# Patient Record
Sex: Female | Born: 1960 | Race: Black or African American | Hispanic: No | Marital: Married | Smoking: Never smoker
Health system: Southern US, Community
[De-identification: ages and names within clinical notes are randomized; demographics above are authoritative.]

## PROBLEM LIST (undated history)

## (undated) DIAGNOSIS — R933 Abnormal findings on diagnostic imaging of other parts of digestive tract: Secondary | ICD-10-CM

## (undated) DIAGNOSIS — T4145XA Adverse effect of unspecified anesthetic, initial encounter: Secondary | ICD-10-CM

## (undated) DIAGNOSIS — F32A Depression, unspecified: Secondary | ICD-10-CM

## (undated) DIAGNOSIS — A630 Anogenital (venereal) warts: Secondary | ICD-10-CM

## (undated) DIAGNOSIS — E785 Hyperlipidemia, unspecified: Secondary | ICD-10-CM

## (undated) DIAGNOSIS — Z780 Asymptomatic menopausal state: Secondary | ICD-10-CM

## (undated) DIAGNOSIS — T8859XA Other complications of anesthesia, initial encounter: Secondary | ICD-10-CM

## (undated) DIAGNOSIS — F329 Major depressive disorder, single episode, unspecified: Secondary | ICD-10-CM

## (undated) HISTORY — DX: Hyperlipidemia, unspecified: E78.5

## (undated) HISTORY — PX: BUNIONECTOMY: SHX129

## (undated) HISTORY — DX: Major depressive disorder, single episode, unspecified: F32.9

## (undated) HISTORY — DX: Anogenital (venereal) warts: A63.0

## (undated) HISTORY — DX: Depression, unspecified: F32.A

---

## 1998-12-24 ENCOUNTER — Other Ambulatory Visit: Admission: RE | Admit: 1998-12-24 | Discharge: 1998-12-24 | Payer: Self-pay | Admitting: Obstetrics

## 1999-07-07 ENCOUNTER — Ambulatory Visit (HOSPITAL_COMMUNITY): Admission: RE | Admit: 1999-07-07 | Discharge: 1999-07-07 | Payer: Self-pay | Admitting: Obstetrics

## 2001-10-26 ENCOUNTER — Encounter: Payer: Self-pay | Admitting: Emergency Medicine

## 2001-10-26 ENCOUNTER — Emergency Department (HOSPITAL_COMMUNITY): Admission: EM | Admit: 2001-10-26 | Discharge: 2001-10-26 | Payer: Self-pay | Admitting: Emergency Medicine

## 2002-01-03 ENCOUNTER — Inpatient Hospital Stay (HOSPITAL_COMMUNITY): Admission: EM | Admit: 2002-01-03 | Discharge: 2002-01-05 | Payer: Self-pay | Admitting: Psychiatry

## 2002-04-03 ENCOUNTER — Encounter: Admission: RE | Admit: 2002-04-03 | Discharge: 2002-04-03 | Payer: Self-pay | Admitting: Obstetrics

## 2002-04-03 ENCOUNTER — Encounter: Payer: Self-pay | Admitting: Obstetrics

## 2003-08-12 ENCOUNTER — Emergency Department (HOSPITAL_COMMUNITY): Admission: EM | Admit: 2003-08-12 | Discharge: 2003-08-12 | Payer: Self-pay | Admitting: Emergency Medicine

## 2005-04-14 ENCOUNTER — Emergency Department (HOSPITAL_COMMUNITY): Admission: EM | Admit: 2005-04-14 | Discharge: 2005-04-14 | Payer: Self-pay | Admitting: Family Medicine

## 2005-07-17 ENCOUNTER — Encounter: Admission: RE | Admit: 2005-07-17 | Discharge: 2005-07-17 | Payer: Self-pay | Admitting: Obstetrics

## 2005-07-30 ENCOUNTER — Encounter: Admission: RE | Admit: 2005-07-30 | Discharge: 2005-07-30 | Payer: Self-pay | Admitting: Obstetrics

## 2005-09-16 ENCOUNTER — Emergency Department (HOSPITAL_COMMUNITY): Admission: EM | Admit: 2005-09-16 | Discharge: 2005-09-16 | Payer: Self-pay | Admitting: Emergency Medicine

## 2006-08-05 ENCOUNTER — Encounter: Admission: RE | Admit: 2006-08-05 | Discharge: 2006-08-05 | Payer: Self-pay | Admitting: Obstetrics

## 2006-12-21 HISTORY — PX: HYSTEROSCOPY W/ ENDOMETRIAL ABLATION: SUR665

## 2007-04-19 ENCOUNTER — Emergency Department (HOSPITAL_COMMUNITY): Admission: EM | Admit: 2007-04-19 | Discharge: 2007-04-19 | Payer: Self-pay | Admitting: Emergency Medicine

## 2007-06-02 ENCOUNTER — Encounter (INDEPENDENT_AMBULATORY_CARE_PROVIDER_SITE_OTHER): Payer: Self-pay | Admitting: Obstetrics

## 2007-06-02 ENCOUNTER — Ambulatory Visit (HOSPITAL_COMMUNITY): Admission: RE | Admit: 2007-06-02 | Discharge: 2007-06-02 | Payer: Self-pay | Admitting: Obstetrics

## 2007-08-08 ENCOUNTER — Ambulatory Visit (HOSPITAL_COMMUNITY): Admission: RE | Admit: 2007-08-08 | Discharge: 2007-08-08 | Payer: Self-pay | Admitting: Obstetrics

## 2008-03-18 ENCOUNTER — Emergency Department (HOSPITAL_COMMUNITY): Admission: EM | Admit: 2008-03-18 | Discharge: 2008-03-18 | Payer: Self-pay | Admitting: Emergency Medicine

## 2010-06-18 ENCOUNTER — Ambulatory Visit (HOSPITAL_COMMUNITY): Admission: RE | Admit: 2010-06-18 | Discharge: 2010-06-18 | Payer: Self-pay | Admitting: Obstetrics

## 2011-01-02 ENCOUNTER — Encounter: Payer: Self-pay | Admitting: Podiatry

## 2011-01-02 ENCOUNTER — Ambulatory Visit (HOSPITAL_COMMUNITY)
Admission: RE | Admit: 2011-01-02 | Discharge: 2011-01-02 | Payer: Self-pay | Source: Home / Self Care | Attending: Podiatry | Admitting: Podiatry

## 2011-05-05 NOTE — Op Note (Signed)
NAMECYRIL, Heidi Sanders            ACCOUNT NO.:  [id number]   MEDICAL RECORD NO.:  [id number]          PATIENT TYPE:  AMB   LOCATION:  SDC                           FACILITY:  [facility]   PHYSICIAN:  Kathreen Cosier, M.D.DATE OF BIRTH:  [birth year]   DATE OF PROCEDURE:  06/02/2007  DATE OF DISCHARGE:                               OPERATIVE REPORT   PREOPERATIVE DIAGNOSIS:  Dysfunctional uterine bleeding.   PROCEDURE:  1. Hysteroscopy.  2. D&C.  3. NovaSure.   Under general anesthesia, patient in lithotomy position, perineum and  vagina prepped and draped, bladder emptied through straight catheter.  Bimanual exam revealed uterus to be normal size, negative adnexa.  Speculum placed in the vagina, cervix injected with 9 mL of 1%  Xylocaine.  The anterior lip of the cervix grasped with a tenaculum and  the cervix curetted.  A small amount of tissue obtained.  Endometrial  cavity sounded 10 cm.  The cervical length was measured at 5 cm with the  Hegar dilator.  Cervix was dilated to 25 Pratt and the hysteroscope  inserted and it was noted that she had polyps on the posterior aspect of  the uterus, hysteroscope removed and then sharp curettage performed,  revealing a large amount of tissue.  NovaSure device was then inserted  and the cavity integrity test passed.  NovaSure ablation __________  which was 5 cm.  Ablation was done at 138 watts at 1 minute and 21  seconds.  A repeat hysteroscopy showed total ablation of the cavity.  Fluid deficit 105 mL.  Patient tolerated the procedure well.  Taken to  recovery room in good condition.           ______________________________  Kathreen Cosier, M.D.     BAM/MEDQ  D:  06/02/2007  T:  06/02/2007  Job:  [id number]

## 2011-05-08 NOTE — Discharge Summary (Signed)
[facility]  Patient:    Heidi Sanders, Heidi Sanders Visit Number: [id number] MRN: [id number]          Service Type: PSY Location: 500 0505 01 Attending Physician:  Rachael Fee Dictated by:   Reymundo Poll Dub Mikes, M.D. Admit Date:  01/03/2002 Discharge Date: 01/05/2002                             Discharge Summary  CHIEF COMPLAINT AND HISTORY OF PRESENT ILLNESS:  This was the first admission to [facility] for this 50 year old female brought to the emergency room by EMS after an overdose on six tablets of Excedrin.  She was arguing with her husband three days prior to admission and reported to emergency room staff that she had not eaten or slept in the past three days. The patient reported that she took her overdose after a call from her husbands new girlfriend and feeling increased frustration.  Whether or not she was actually suicidal at the time seems unclear.  Stated that she took the six Excedrin and then called about six of her friends telling them that she was going to take a long nap and one of them called emergency services.  The patient denied that she had any suicidal ideas, children to live for and take care of, and a house.  Did admit feeling more depressed, decreased appetite, 20 pound weight loss in the past three months.  Stressors: Basically the situation with her husband.  Had been taking Zoloft 50 mg per day.  PAST PSYCHIATRIC HISTORY:  No outpatient treatment; first inpatient treatment.  SUBSTANCE ABUSE HISTORY:  Denies any substance abuse.  PAST MEDICAL HISTORY:  No active history of medical conditions.  MEDICATIONS ON ADMISSION:  Zoloft 50 mg per day for four days but then stopped it.  PHYSICAL EXAMINATION:  GENERAL:  Performed, no acute findings.  MENTAL STATUS EXAMINATION ON ADMISSION:  Healthy, fully alert female, no acute distress, well-groomed, appropriate affect, good eye contact.  Speech was normal  and relevant, no pressure noted, spontaneous.  Mood was fairly euthymic.  Thought processes were logical and coherent, goal directed, no evidence of suicidal ideas, was able to promise safety, no homicidal ideas. Cognitive: Well preserved.  ADMITTING DIAGNOSES: Axis I:    Major depression, single episode. Axis II:   Deferred. Axis III:  Status post acetaminophen overdose. Axis IV:   Moderate. Axis V:    Global assessment of functioning upon admission 36, highest global            assessment of functioning in the last year 55-80.  HOSPITAL COURSE:  She was admitted and started in intensive individual and group psychotherapy.  Started working on the issues that led her to take the overdose.  She went back on Zoloft.  There was a family session with the patient and daughter.  It went well, she felt supported.  On January 16, she was feeling much better.  Mood seemed to be improved, affect bright and broad, no suicidal ideas, no homicidal ideas, willing to follow up on an outpatient basis.  Felt that she had support that she now knows is there.  She was motivated to pursue treatment and feel better.  DISCHARGE DIAGNOSES: Axis I:    Major depression, single episode. Axis II:   No diagnosis. Axis III:  No diagnosis. Axis IV:   Moderate. Axis V:    Global assessment of functioning upon  discharge 65.  DISCHARGE MEDICATIONS: 1. Zoloft 50 mg per day. 2. Ambien 10 mg at bedtime for sleep.  FOLLOWUP:  [facility] IOP as well as Dr. Natalia Leatherwood ______ for individual counseling. Dictated by:   Reymundo Poll Dub Mikes, M.D. Attending Physician:  Rachael Fee DD:  02/08/02 TD:  02/09/02 Job: [id number]

## 2011-05-08 NOTE — H&P (Signed)
[facility]  Patient:    Heidi Sanders, HELBLING Visit Number: [id number] MRN: [id number]          Service Type: PSY Location: 500 0505 01 Attending Physician:  Rachael Fee Dictated by:   Young Berry Scott, R.N. N.P. Admit Date:  01/03/2002                     Psychiatric Admission Assessment  DATE OF ADMISSION:  January 03, 2002  DATE OF ASSESSMENT:  January 04, 2002, at 11:05 a.m.  PATIENT IDENTIFICATION:  This 50 year old African American female is a voluntary admission.  HISTORY OF PRESENT ILLNESS:  This patient was brought to the emergency room by EMS after an intentional overdose on six tablets of Excedrin.  The patient reports a serious argument with her husband approximately three days ago and reported to emergency room staff that she had not eaten or slept in the past three days since the argument.  Today the patient reports that she took her overdose after a call from her husbands new girlfriend and feeling increased frustration.  Whether or not she was actually suicidal at the time seems unclear in talking to her.  She states that she took the six Excedrin and then called about six of her friends, telling them that she was going to take a long nap and one of them called emergency services.  Today the patient denies that she has any suicidal ideation stating, "I have children to live for, vehicle to take care of, and a house."  The patient denies any homicidal ideation, denies any auditory or visual hallucinations.  She denies that she has had any disruption of her sleep over the last few months but she does report decreased appetite with approximate 20 pound weight loss in the past three months.  She cites marital stressors with her husband as one of her primary stressors and has been attending marital counseling for the past 1-2 months.  She had been taking Zoloft prescribed by her primary care Graciano Batson but states she only took it  approximately four days and then quit it because she did not want to be on drugs.  PAST PSYCHIATRIC HISTORY:  The patient has no history of outpatient treatment. She has been receiving marital counseling from a psychotherapist named Santina Evans ______ .  This is the patients first psychiatric inpatient admission.  This is her first admission to [facility].  SUBSTANCE ABUSE HISTORY:  The patients urine drug screen was negative.  The patient denies any substance abuse, denies any alcohol abuse.  PAST MEDICAL HISTORY:  The patient is followed by Dr. Francoise Ceo who is her OB/GYN physician and Dr. Wylene Simmer on [facility] here in [place] who is her primary care Champ Keetch.  Medical problems are currently none.  Past medical history is remarkable for a bilateral tubal ligation and removal of a right bunion in January 2001.  MEDICATIONS:  As previously noted, the patient was on Zoloft for four days in October 2002, but stopped taking it.  DRUG ALLERGIES:  None.  PHYSICAL EXAMINATION:  GENERAL:  The patients physical examination was done at the [facility] Emergency Department and was essentially negative.  The patient is 5 feet 6 inches tall, weighs 126 pounds.  VITAL SIGNS:  Temperature 99.1, pulse 77, respirations 22, blood pressure 119/77.  LABORATORY DATA:  Acetaminophen level was initially 56.9 in the emergency room taken at approximately 4 to 4-1/2 hours after ingestion then was 25  at approximately three hours later and finally at 5:30 this morning was less than 10.  The patient was charcoaled and lavaged in the emergency room.  Salicylate level was less than 4.  CBC and CMET were within normal limits.  These acetaminophen levels and dosage times were reviewed with Poison Control and they agreed that they were nontoxic levels requiring no treatment.  Thyroid panel is currently pending.  SOCIAL HISTORY:  The patient has been married for the past 12  years and separated from her husband since Saturday.  He has moved to [place].  The patient remains here in [place].  She has three children ages 78, 54, and 55.  The patient is currently living at home here in [place] with her 69-year-old and 49 year old.  She has a 49 year old who is away in college. The patient is employed as a renal Teacher, early years/pre at [employer].  FAMILY HISTORY:  The patient has a history of two cousins who committed suicide.  MENTAL STATUS EXAMINATION:  This is a healthy appearing, fully alert African American female who is of medium build.  She is in no acute distress. She is well-groomed with an appropriate affect and good eye contact and focus. Speech is normal and relevant, no pressure noted, spontaneous.  Mood is fairly euthymic.  Thought process is logical and coherent, definitely goal directed. No evidence of suicidal ideation at this time and the patient is able to promise safety both on the unit and in the community.  No homicidal ideation, no evidence of psychosis, no dangerous ideations.  Cognitive: Intact and oriented x3.  The patient seems resigned to living without her husband, really does not want to take medications.  ADMISSION DIAGNOSES: Axis I:    1. Adjustment disorder, not otherwise specified.            2. Rule out major depression, single episode, moderate. Axis II:   Deferred. Axis III:  Status post acetaminophen overdose. Axis IV:   Moderate problems with the primary support group, specifically            adjustment to separation from her husband and conflict with her            spouse. Axis V:    GAF: Current 36, past year 80.  INITIAL PLAN OF CARE:  Plan is to voluntarily admit the patient to stabilize her mood and ensure her safety and to ensure no suicidal ideation.  We will ask the case manager to contact this patients 13 year old daughter as soon as possible or another family member to get some additional  history and to enlist some family support in our safety plan for her.  We have started her on Zoloft  50 mg daily and Ambien 10 mg at h.s. and she did sleep well on the medication last night.  We will consider discharge on January 16, if we can get some support from another family member and enlist some support for a treatment plan.  Meanwhile, as previously noted, the patients thyroid panel is currently pending and Poison Control agrees the acetaminophen overdose was nontoxic.  We will consider discharging her on January 16.  ESTIMATED LENGTH OF STAY:  One to two days. Dictated by:   Young Berry Scott, R.N. N.P. Attending Physician:  Rachael Fee DD:  01/04/02 TD:  01/04/02 Job: [id number]

## 2011-09-14 LAB — URINALYSIS, ROUTINE W REFLEX MICROSCOPIC
Bilirubin Urine: NEGATIVE
Glucose, UA: NEGATIVE
Ketones, ur: NEGATIVE
Nitrite: NEGATIVE
Protein, ur: 30 — AB
Specific Gravity, Urine: 1.027
Urobilinogen, UA: 1
pH: 6.5

## 2011-09-14 LAB — URINE MICROSCOPIC-ADD ON

## 2011-09-14 LAB — URINE CULTURE: Colony Count: 15000

## 2011-09-29 ENCOUNTER — Other Ambulatory Visit (HOSPITAL_COMMUNITY): Payer: Self-pay | Admitting: Obstetrics

## 2011-09-29 DIAGNOSIS — Z1231 Encounter for screening mammogram for malignant neoplasm of breast: Secondary | ICD-10-CM

## 2011-10-08 LAB — CBC
HCT: 29.9 — ABNORMAL LOW
Hemoglobin: 9.3 — ABNORMAL LOW
MCHC: 31
MCV: 71.5 — ABNORMAL LOW
Platelets: 400
RBC: 4.19
RDW: 15.8 — ABNORMAL HIGH
WBC: 6

## 2011-10-22 ENCOUNTER — Ambulatory Visit (HOSPITAL_COMMUNITY): Payer: Self-pay

## 2011-11-17 ENCOUNTER — Ambulatory Visit (HOSPITAL_COMMUNITY): Payer: No Typology Code available for payment source | Attending: Obstetrics

## 2012-01-26 ENCOUNTER — Other Ambulatory Visit (HOSPITAL_COMMUNITY): Payer: Self-pay | Admitting: Obstetrics

## 2012-01-26 DIAGNOSIS — Z1231 Encounter for screening mammogram for malignant neoplasm of breast: Secondary | ICD-10-CM

## 2012-03-15 ENCOUNTER — Encounter: Payer: Self-pay | Admitting: Internal Medicine

## 2012-03-15 ENCOUNTER — Ambulatory Visit (HOSPITAL_COMMUNITY): Payer: Self-pay

## 2012-03-31 ENCOUNTER — Ambulatory Visit (HOSPITAL_COMMUNITY)
Admission: RE | Admit: 2012-03-31 | Discharge: 2012-03-31 | Disposition: A | Discharge status: Home / Self Care | Payer: BC Managed Care – PPO | Source: Ambulatory Visit | Attending: Obstetrics | Admitting: Obstetrics

## 2012-03-31 DIAGNOSIS — Z1231 Encounter for screening mammogram for malignant neoplasm of breast: Secondary | ICD-10-CM

## 2012-05-09 ENCOUNTER — Ambulatory Visit (AMBULATORY_SURGERY_CENTER): Payer: BC Managed Care – PPO | Admitting: *Deleted

## 2012-05-09 ENCOUNTER — Encounter: Payer: Self-pay | Admitting: Internal Medicine

## 2012-05-09 VITALS — Ht 65.0 in | Wt 150.0 lb

## 2012-05-09 DIAGNOSIS — Z1211 Encounter for screening for malignant neoplasm of colon: Secondary | ICD-10-CM

## 2012-05-09 MED ORDER — SUPREP BOWEL PREP KIT 17.5-3.13-1.6 GM/177ML PO SOLN: 1.0000 | ORAL | Status: DC

## 2012-05-20 ENCOUNTER — Ambulatory Visit (AMBULATORY_SURGERY_CENTER): Payer: BC Managed Care – PPO | Admitting: Internal Medicine

## 2012-05-20 ENCOUNTER — Encounter: Payer: Self-pay | Admitting: Internal Medicine

## 2012-05-20 VITALS — BP 157/90 | HR 60 | Temp 97.5°F | Resp 18 | Ht 65.0 in | Wt 150.0 lb

## 2012-05-20 DIAGNOSIS — Z1211 Encounter for screening for malignant neoplasm of colon: Secondary | ICD-10-CM

## 2012-05-20 DIAGNOSIS — D126 Benign neoplasm of colon, unspecified: Secondary | ICD-10-CM

## 2012-05-20 DIAGNOSIS — K6282 Dysplasia of anus: Secondary | ICD-10-CM

## 2012-05-20 MED ORDER — SODIUM CHLORIDE 0.9 % IV SOLN: 500.0000 mL | INTRAVENOUS | Status: DC

## 2012-05-20 NOTE — Op Note (Signed)
[facility] [address] [place], [place]  [address]  COLONOSCOPY PROCEDURE REPORT  PATIENT:  Heidi Sanders, Heidi Sanders  MR#:  [id number] BIRTHDATE:  Apr 21, 1961, 50 yrs. old  GENDER:  female ENDOSCOPIST:  Carie Caddy. Ivy Puryear, MD REF. BY:  Guerry Bruin, M.D. PROCEDURE DATE:  05/20/2012 PROCEDURE:  Colonoscopy with snare polypectomy, Colon with cold biopsy polypectomy ASA CLASS:  Class I INDICATIONS:  Routine Risk Screening, 1st colonoscopy MEDICATIONS:   MAC sedation, administered by CRNA, propofol (Diprivan) 300 mg IV  DESCRIPTION OF PROCEDURE:   After the risks benefits and alternatives of the procedure were thoroughly explained, informed consent was obtained.  Digital rectal exam was performed and revealed no rectal masses.   The LB CF-[id number] endoscope was introduced through the anus and advanced to the cecum, which was identified by both the appendix and ileocecal valve, without limitations.  The quality of the prep was Suprep good.  The instrument was then slowly withdrawn as the colon was fully examined.<<PROCEDUREIMAGES>>  FINDINGS:  A 4 mm sessile polyp was found in the ascending colon. Polyp was snared without cautery. Retrieval was successful.   A 6 mm pedunculated polyp was found in the mid transverse colon. Polyp was snared, then cauterized with monopolar cautery. Retrieval was successful.  A 3mm sessile polyp was found in the rectum. The polyp was removed using cold biopsy forceps.  A sessile polyp was found in the anal canal at the dentate line. With standard forceps, biopsies was obtained and sent to pathology.  Scattered diverticula were found throughout the colon.   Retroflexed views in the rectum revealed no other findings other than those already described.   The scope was then withdrawn from the cecum and the procedure completed.  COMPLICATIONS:  None ENDOSCOPIC IMPRESSION: 1) Sessile polyp in the ascending colon. Removed and sent  to pathology. 2) Pedunculated polyp in the mid transverse colon. Removed and sent to pathology. 3) Sessile polyp in the rectum. Removed and sent to pathology. 4) Sessile polyp in the anal canal at dentate line.  Multiple cold biopsies performed and sent to pathology. 5) Diverticula, scattered throughout the colon.  RECOMMENDATIONS: 1) Hold aspirin, aspirin products, and anti-inflammatory medication for 2 weeks. 2) Await pathology results 3) High fiber diet. 4) You will receive a letter within 1-2 weeks with the results of your biopsy as well as final recommendations regarding when to repeat the colonoscopy. Please call my office if you have not received a letter after 3 weeks.  Carie Caddy. Rhea Belton, MD  CC:  Guerry Bruin, MD The Patient  n. eSIGNED:   Carie Caddy. Marsella Suman at 05/20/2012 09:11 AM  Eudelia Bunch, [phone]

## 2012-05-20 NOTE — Patient Instructions (Addendum)

## 2012-05-20 NOTE — Progress Notes (Addendum)
When report received from A Willett RN, she states that Dr. Rhea Belton wants pt to be aware that she might note some bleeding.  Writer made pt and family members aware  Patient did not experience any of the following events: a burn prior to discharge; a fall within the facility; wrong site/side/patient/procedure/implant event; or a hospital transfer or hospital admission upon discharge from the facility. [phone]) Patient did not have preoperative order for IV antibiotic SSI prophylaxis. [phone])

## 2012-05-23 ENCOUNTER — Telehealth: Payer: Self-pay | Admitting: *Deleted

## 2012-05-23 NOTE — Telephone Encounter (Signed)
  Follow up Call-  Call back number 05/20/2012  Post procedure Call Back phone  # [phone]  Permission to leave phone message Yes     Patient questions:  Do you have a fever, pain , or abdominal swelling? no Pain Score  0 *  Have you tolerated food without any problems? yes  Have you been able to return to your normal activities? yes  Do you have any questions about your discharge instructions: Diet   no Medications  no Follow up visit  no  Do you have questions or concerns about your Care? no  Actions: * If pain score is 4 or above: No action needed, pain <4.

## 2012-05-30 ENCOUNTER — Telehealth: Payer: Self-pay | Admitting: *Deleted

## 2012-05-30 ENCOUNTER — Encounter: Payer: Self-pay | Admitting: Internal Medicine

## 2012-05-30 DIAGNOSIS — K6282 Dysplasia of anus: Secondary | ICD-10-CM

## 2012-05-30 NOTE — Telephone Encounter (Signed)
Informed pt of referral to Dr Luisa Hart and that she will be receiving a letter from Dr Rhea Belton explaining her polyps and biopsies; pt stated understanding.

## 2012-05-31 ENCOUNTER — Telehealth: Payer: Self-pay | Admitting: Internal Medicine

## 2012-05-31 NOTE — Telephone Encounter (Signed)
Pt reports she's confused about what I told her yesterday. We went over the COLON report and the Path report and why Dr Rhea Belton states she needs to have a surgeon remove the "polyp" at the dentate line. Informed her we don't thinks it's cancer, but it another path comes back we won't know; pt stated understanding. She will call back this week if she doesn't receive her letter and appt with Dr Luisa Hart.

## 2012-06-21 ENCOUNTER — Ambulatory Visit (INDEPENDENT_AMBULATORY_CARE_PROVIDER_SITE_OTHER): Payer: BC Managed Care – PPO | Admitting: Surgery

## 2012-06-21 ENCOUNTER — Encounter (INDEPENDENT_AMBULATORY_CARE_PROVIDER_SITE_OTHER): Payer: Self-pay | Admitting: Surgery

## 2012-06-21 VITALS — BP 138/82 | HR 70 | Temp 97.6°F | Resp 14 | Ht 63.0 in | Wt 154.1 lb

## 2012-06-21 DIAGNOSIS — K6282 Dysplasia of anus: Secondary | ICD-10-CM

## 2012-06-21 NOTE — Patient Instructions (Signed)
You will be scheduled for   Exam under anesthesia to evaluate anal canal and possibly biopsy.

## 2012-06-21 NOTE — Progress Notes (Signed)
Patient ID: Heidi Sanders, female   DOB: [birth year], 51 y.o.   MRN: [id number]  Chief Complaint  Patient presents with  . New Evaluation    Condyloma    HPI Heidi Sanders is a 51 y.o. female.  HPIPatient sent S. request of Dr. Sharla Kidney due to a lesion removed in the anal canal during colonoscopy that showed AIN grade 1. Patient denies history of rectal bleeding, pain, discharge or history of anal condyloma.  Past Medical History  Diagnosis Date  . Depression   . Hyperlipidemia   . Condyloma     Past Surgical History  Procedure Date  . Bunionectomy 12/2010 &2002    left 2012             Right 2002  . Hysteroscopy w/ endometrial ablation 2008    Family History  Problem Relation Age of Onset  . Colon polyps Sister   . Colon polyps Sister   . Diabetes Father     Social History History  Substance Use Topics  . Smoking status: Never Smoker   . Smokeless tobacco: Never Used  . Alcohol Use: No    No Known Allergies  Current Outpatient Prescriptions  Medication Sig Dispense Refill  . BLACK COHOSH EXTRACT PO Take by mouth 3 (three) times daily.      . NON FORMULARY as needed. Assured pain relief PM      . Soy Isoflavone 40 MG TABS Take by mouth 3 (three) times daily.      Marland Kitchen aspirin-acetaminophen-caffeine (EXCEDRIN MIGRAINE) 250-250-65 MG per tablet Take 2 tablets by mouth every 6 (six) hours as needed. headaches      . Diphenhydramine-APAP, sleep, (EXCEDRIN PM PO) Take 1 tablet by mouth at bedtime as needed. sleep      . Mirtazapine (REMERON PO) Take 1 tablet by mouth at bedtime as needed. depression        Review of Systems Review of Systems  Constitutional: Negative.   HENT: Negative.   Eyes: Negative.   Respiratory: Negative.   Cardiovascular: Negative.   Gastrointestinal: Negative.   Genitourinary: Negative.   Musculoskeletal: Negative.   Neurological: Negative.   Hematological: Negative.   Psychiatric/Behavioral: Negative.     Blood pressure 138/82,  pulse 70, temperature 97.6 F (36.4 C), temperature source Temporal, resp. rate 14, height 5\' 3"  (1.6 m), weight 154 lb 2 oz (69.911 kg).  Physical Exam Physical Exam  Constitutional: She is oriented to person, place, and time. She appears well-developed and well-nourished.  HENT:  Head: Normocephalic and atraumatic.  Eyes: EOM are normal. Pupils are equal, round, and reactive to light.  Neck: Normal range of motion. Neck supple.  Cardiovascular: Normal rate and regular rhythm.   Pulmonary/Chest: Effort normal and breath sounds normal.  Abdominal: Soft. Bowel sounds are normal.  Genitourinary:     Musculoskeletal: Normal range of motion.  Neurological: She is alert and oriented to person, place, and time.  Skin: Skin is warm and dry.  Psychiatric: She has a normal mood and affect. Her behavior is normal. Judgment and thought content normal.    Data Reviewed Colonoscopy report and  Path report  Assessment    AIN grade 1 anal canal    Plan    EUA  and biopsy if needed. Will need follow up after. Risks of bleeding,  Infection, organ injury,  More surgery and progression of disease.  She agrees to proceed.       Armina Galloway A. 06/21/2012, 2:23 PM

## 2012-06-27 ENCOUNTER — Telehealth (INDEPENDENT_AMBULATORY_CARE_PROVIDER_SITE_OTHER): Payer: Self-pay | Admitting: General Surgery

## 2012-06-27 NOTE — Telephone Encounter (Signed)
Pt is calling in with two questions about her surgery on 07/25/12.  First, she would like to know the route planned for her surgery, and second, how long should she plan to be out of work.  Please call her on her cell phone.

## 2012-06-28 NOTE — Telephone Encounter (Signed)
Called pt and left detail voice mail on identified cell number, with answers to her questions from Dr. Luisa Hart.

## 2012-06-28 NOTE — Telephone Encounter (Signed)
EXAM UNDER ANESTHESIA   OOW 1- 2DAYS.  OUTPATIENT

## 2012-07-05 ENCOUNTER — Encounter (HOSPITAL_COMMUNITY): Payer: Self-pay | Admitting: Pharmacy Technician

## 2012-07-21 ENCOUNTER — Encounter (HOSPITAL_COMMUNITY)
Admission: RE | Admit: 2012-07-21 | Discharge: 2012-07-21 | Disposition: A | Discharge status: Home / Self Care | Payer: BC Managed Care – PPO | Source: Ambulatory Visit | Attending: Surgery | Admitting: Surgery

## 2012-07-21 ENCOUNTER — Encounter (HOSPITAL_COMMUNITY): Payer: Self-pay

## 2012-07-21 HISTORY — DX: Asymptomatic menopausal state: Z78.0

## 2012-07-21 HISTORY — DX: Other complications of anesthesia, initial encounter: T88.59XA

## 2012-07-21 HISTORY — DX: Abnormal findings on diagnostic imaging of other parts of digestive tract: R93.3

## 2012-07-21 HISTORY — DX: Adverse effect of unspecified anesthetic, initial encounter: T41.45XA

## 2012-07-21 LAB — COMPREHENSIVE METABOLIC PANEL WITH GFR
ALT: 12 U/L (ref 0–35)
AST: 21 U/L (ref 0–37)
Albumin: 4 g/dL (ref 3.5–5.2)
Alkaline Phosphatase: 85 U/L (ref 39–117)
BUN: 14 mg/dL (ref 6–23)
CO2: 32 meq/L (ref 19–32)
Calcium: 9.9 mg/dL (ref 8.4–10.5)
Chloride: 102 meq/L (ref 96–112)
Creatinine, Ser: 0.95 mg/dL (ref 0.50–1.10)
GFR calc Af Amer: 80 mL/min — ABNORMAL LOW
GFR calc non Af Amer: 69 mL/min — ABNORMAL LOW
Glucose, Bld: 93 mg/dL (ref 70–99)
Potassium: 4.8 meq/L (ref 3.5–5.1)
Sodium: 138 meq/L (ref 135–145)
Total Bilirubin: 0.6 mg/dL (ref 0.3–1.2)
Total Protein: 7.8 g/dL (ref 6.0–8.3)

## 2012-07-21 LAB — DIFFERENTIAL
Basophils Absolute: 0 K/uL (ref 0.0–0.1)
Basophils Relative: 0 % (ref 0–1)
Eosinophils Absolute: 0.1 K/uL (ref 0.0–0.7)
Eosinophils Relative: 2 % (ref 0–5)
Lymphocytes Relative: 40 % (ref 12–46)
Lymphs Abs: 2.1 K/uL (ref 0.7–4.0)
Monocytes Absolute: 0.3 K/uL (ref 0.1–1.0)
Monocytes Relative: 6 % (ref 3–12)
Neutro Abs: 2.8 K/uL (ref 1.7–7.7)
Neutrophils Relative %: 53 % (ref 43–77)

## 2012-07-21 LAB — SURGICAL PCR SCREEN
MRSA, PCR: NEGATIVE
Staphylococcus aureus: POSITIVE — AB

## 2012-07-21 LAB — CBC
HCT: 43.1 % (ref 36.0–46.0)
Hemoglobin: 14.2 g/dL (ref 12.0–15.0)
MCH: 28.2 pg (ref 26.0–34.0)
MCHC: 32.9 g/dL (ref 30.0–36.0)
MCV: 85.7 fL (ref 78.0–100.0)
Platelets: 278 10*3/uL (ref 150–400)
RBC: 5.03 MIL/uL (ref 3.87–5.11)
RDW: 12.8 % (ref 11.5–15.5)
WBC: 5.3 10*3/uL (ref 4.0–10.5)

## 2012-07-21 NOTE — Pre-Procedure Instructions (Signed)
CBC, DIFF, CMET WERE DONE AT [facility] PREOP.  CXR AND EKG, SERUM PREGNANCY TEST  NOT NEEDED PER ANESTHESIOLOGIST'S GUIDELINES. PREOP INSTRUCTIONS DISCUSSED WITH PT USING TEACH BACK METHOD.

## 2012-07-21 NOTE — Patient Instructions (Signed)
YOUR SURGERY IS SCHEDULED ON: Monday  8/5  AT 9:00 AM  REPORT TO [place] [facility]AY [facility] AT:  7:00 AM      PHONE # FOR SHORT STAY IS [phone]  DO NOT EAT OR DRINK ANYTHING AFTER MIDNIGHT THE NIGHT BEFORE YOUR SURGERY.  YOU MAY BRUSH YOUR TEETH, RINSE OUT YOUR MOUTH--BUT NO WATER, NO FOOD, NO CHEWING GUM, NO MINTS, NO CANDIES, NO CHEWING TOBACCO.  PLEASE TAKE THE FOLLOWING MEDICATIONS THE AM OF YOUR SURGERY WITH A FEW SIPS OF WATER:  NO MEDICINES TO TAKE    IF YOU USE INHALERS--USE YOUR INHALERS THE AM OF YOUR SURGERY AND BRING INHALERS TO THE HOSPITAL -TAKE TO SURGERY.    IF YOU ARE DIABETIC:  DO NOT TAKE ANY DIABETIC MEDICATIONS THE AM OF YOUR SURGERY.  IF YOU TAKE INSULIN IN THE EVENINGS--PLEASE ONLY TAKE 1/2 NORMAL EVENING DOSE THE NIGHT BEFORE YOUR SURGERY.  NO INSULIN THE AM OF YOUR SURGERY.  IF YOU HAVE SLEEP APNEA AND USE CPAP OR BIPAP--PLEASE BRING THE MASK --NOT THE MACHINE-NOT THE TUBING   -JUST THE MASK. DO NOT BRING VALUABLES, MONEY, CREDIT CARDS.  CONTACT LENS, DENTURES / PARTIALS, GLASSES SHOULD NOT BE WORN TO SURGERY AND IN MOST CASES-HEARING AIDS WILL NEED TO BE REMOVED.  BRING YOUR GLASSES CASE, ANY EQUIPMENT NEEDED FOR YOUR CONTACT LENS. FOR PATIENTS ADMITTED TO THE HOSPITAL--CHECK OUT TIME THE DAY OF DISCHARGE IS 11:00 AM.  ALL INPATIENT ROOMS ARE PRIVATE - WITH BATHROOM, TELEPHONE, TELEVISION AND WIFI INTERNET. IF YOU ARE BEING DISCHARGED THE SAME DAY OF YOUR SURGERY--YOU CAN NOT DRIVE YOURSELF HOME--AND SHOULD NOT GO HOME ALONE BY TAXI OR BUS.  NO DRIVING OR OPERATING MACHINERY FOR 24 HOURS FOLLOWING ANESTHESIA / PAIN MEDICATIONS.                            SPECIAL INSTRUCTIONS:  CHLORHEXIDINE SOAP SHOWER (other brand names are Betasept and Hibiclens ) PLEASE SHOWER WITH CHLORHEXIDINE THE NIGHT BEFORE YOUR SURGERY AND THE AM OF YOUR SURGERY. DO NOT USE CHLORHEXIDINE ON YOUR FACE OR PRIVATE AREAS--YOU MAY USE YOUR NORMAL SOAP THOSE AREAS AND YOUR NORMAL SHAMPOO.    WOMEN SHOULD AVOID SHAVING UNDER ARMS AND SHAVING LEGS 48 HOURS BEFORE USING CHLORHEXIDINE TO AVOID SKIN IRRITATION.  DO NOT USE IF ALLERGIC TO CHLORHEXIDINE.  PLEASE READ OVER ANY  FACT SHEETS THAT YOU WERE GIVEN: MRSA INFORMATION

## 2012-07-25 ENCOUNTER — Encounter (HOSPITAL_COMMUNITY)
Admission: RE | Disposition: A | Discharge status: Home / Self Care | Payer: Self-pay | Source: Ambulatory Visit | Attending: Surgery

## 2012-07-25 ENCOUNTER — Ambulatory Visit (HOSPITAL_COMMUNITY)
Admission: RE | Admit: 2012-07-25 | Discharge: 2012-07-25 | Disposition: A | Discharge status: Home / Self Care | Payer: BC Managed Care – PPO | Source: Ambulatory Visit | Attending: Surgery | Admitting: Surgery

## 2012-07-25 ENCOUNTER — Encounter (HOSPITAL_COMMUNITY): Payer: Self-pay | Admitting: Registered Nurse

## 2012-07-25 ENCOUNTER — Encounter (HOSPITAL_COMMUNITY): Payer: Self-pay | Admitting: *Deleted

## 2012-07-25 ENCOUNTER — Ambulatory Visit (HOSPITAL_COMMUNITY): Payer: BC Managed Care – PPO | Admitting: Registered Nurse

## 2012-07-25 DIAGNOSIS — Z9889 Other specified postprocedural states: Secondary | ICD-10-CM

## 2012-07-25 DIAGNOSIS — Z79899 Other long term (current) drug therapy: Secondary | ICD-10-CM | POA: Insufficient documentation

## 2012-07-25 DIAGNOSIS — Z01812 Encounter for preprocedural laboratory examination: Secondary | ICD-10-CM | POA: Insufficient documentation

## 2012-07-25 DIAGNOSIS — K6282 Dysplasia of anus: Secondary | ICD-10-CM | POA: Insufficient documentation

## 2012-07-25 DIAGNOSIS — E785 Hyperlipidemia, unspecified: Secondary | ICD-10-CM | POA: Insufficient documentation

## 2012-07-25 HISTORY — PX: EXAMINATION UNDER ANESTHESIA: SHX1540

## 2012-07-25 SURGERY — EXAM UNDER ANESTHESIA
Anesthesia: General | Site: Anus | Wound class: Contaminated

## 2012-07-25 MED ORDER — MIDAZOLAM HCL 5 MG/5ML IJ SOLN
INTRAMUSCULAR | Status: DC | PRN
  Administered 2012-07-25: 2 mg via INTRAVENOUS

## 2012-07-25 MED ORDER — CEFAZOLIN SODIUM-DEXTROSE 2-3 GM-% IV SOLR
INTRAVENOUS | Status: AC
  Filled 2012-07-25: qty 50

## 2012-07-25 MED ORDER — LACTATED RINGERS IV SOLN
INTRAVENOUS | Status: DC
  Administered 2012-07-25: 10:00:00 via INTRAVENOUS
  Administered 2012-07-25: 1000 mL via INTRAVENOUS

## 2012-07-25 MED ORDER — OXYCODONE-ACETAMINOPHEN 5-325 MG PO TABS: 1.0000 | ORAL_TABLET | ORAL | Status: AC | PRN

## 2012-07-25 MED ORDER — CEFAZOLIN SODIUM-DEXTROSE 2-3 GM-% IV SOLR
2.0000 g | INTRAVENOUS | Status: AC
  Administered 2012-07-25: 2 g via INTRAVENOUS

## 2012-07-25 MED ORDER — ACETAMINOPHEN 10 MG/ML IV SOLN
INTRAVENOUS | Status: AC
  Filled 2012-07-25: qty 100

## 2012-07-25 MED ORDER — DEXTROSE 5 % IV SOLN: 3.0000 g | INTRAVENOUS | Status: DC

## 2012-07-25 MED ORDER — DEXAMETHASONE SODIUM PHOSPHATE 10 MG/ML IJ SOLN
INTRAMUSCULAR | Status: DC | PRN
  Administered 2012-07-25: 10 mg via INTRAVENOUS

## 2012-07-25 MED ORDER — LIDOCAINE HCL 1 % IJ SOLN
INTRAMUSCULAR | Status: DC | PRN
  Administered 2012-07-25: 60 mg via INTRADERMAL

## 2012-07-25 MED ORDER — ONDANSETRON HCL 4 MG/2ML IJ SOLN
INTRAMUSCULAR | Status: DC | PRN
  Administered 2012-07-25: 4 mg via INTRAVENOUS

## 2012-07-25 MED ORDER — PROPOFOL 10 MG/ML IV EMUL
INTRAVENOUS | Status: DC | PRN
  Administered 2012-07-25: 200 mg via INTRAVENOUS

## 2012-07-25 MED ORDER — ACETAMINOPHEN 10 MG/ML IV SOLN
INTRAVENOUS | Status: DC | PRN
  Administered 2012-07-25: 1000 mg via INTRAVENOUS

## 2012-07-25 MED ORDER — BUPIVACAINE LIPOSOME 1.3 % IJ SUSP
20.0000 mL | Freq: Once | INTRAMUSCULAR | Status: AC
  Administered 2012-07-25: 20 mL
  Filled 2012-07-25: qty 20

## 2012-07-25 MED ORDER — FENTANYL CITRATE 0.05 MG/ML IJ SOLN
INTRAMUSCULAR | Status: DC | PRN
  Administered 2012-07-25: 100 ug via INTRAVENOUS

## 2012-07-25 SURGICAL SUPPLY — 32 items
BLADE HEX COATED 2.75 (ELECTRODE) ×2 IMPLANT
BLADE SURG 15 STRL LF DISP TIS (BLADE) ×1 IMPLANT
BLADE SURG 15 STRL SS (BLADE) ×2
CLOTH BEACON ORANGE TIMEOUT ST (SAFETY) ×2 IMPLANT
DECANTER SPIKE VIAL GLASS SM (MISCELLANEOUS) ×2 IMPLANT
DRAIN PENROSE 18X1/2 LTX STRL (DRAIN) IMPLANT
DRSG PAD ABDOMINAL 8X10 ST (GAUZE/BANDAGES/DRESSINGS) ×1 IMPLANT
ELECT REM PT RETURN 9FT ADLT (ELECTROSURGICAL) ×2
ELECTRODE REM PT RTRN 9FT ADLT (ELECTROSURGICAL) ×1 IMPLANT
GAUZE SPONGE 4X4 16PLY XRAY LF (GAUZE/BANDAGES/DRESSINGS) ×2 IMPLANT
GLOVE BIOGEL PI IND STRL 7.0 (GLOVE) ×1 IMPLANT
GLOVE BIOGEL PI INDICATOR 7.0 (GLOVE) ×1
GOWN STRL NON-REIN LRG LVL3 (GOWN DISPOSABLE) ×2 IMPLANT
GOWN STRL REIN XL XLG (GOWN DISPOSABLE) ×4 IMPLANT
KIT BASIN OR (CUSTOM PROCEDURE TRAY) ×2 IMPLANT
LUBRICANT JELLY K Y 4OZ (MISCELLANEOUS) ×2 IMPLANT
NDL HYPO 25X1 1.5 SAFETY (NEEDLE) ×1 IMPLANT
NDL SAFETY ECLIPSE 18X1.5 (NEEDLE) ×1 IMPLANT
NEEDLE HYPO 18GX1.5 SHARP (NEEDLE) ×2
NEEDLE HYPO 25X1 1.5 SAFETY (NEEDLE) ×2 IMPLANT
NS IRRIG 1000ML POUR BTL (IV SOLUTION) ×2 IMPLANT
PACK LITHOTOMY IV (CUSTOM PROCEDURE TRAY) ×1 IMPLANT
PENCIL BUTTON HOLSTER BLD 10FT (ELECTRODE) ×2 IMPLANT
SPONGE GAUZE 4X4 12PLY (GAUZE/BANDAGES/DRESSINGS) ×2 IMPLANT
SPONGE SURGIFOAM ABS GEL 12-7 (HEMOSTASIS) ×1 IMPLANT
SUT CHROMIC 2 0 SH (SUTURE) IMPLANT
SUT CHROMIC 3 0 SH 27 (SUTURE) IMPLANT
SYR BULB IRRIGATION 50ML (SYRINGE) ×1 IMPLANT
SYR CONTROL 10ML LL (SYRINGE) ×2 IMPLANT
TOWEL OR 17X26 10 PK STRL BLUE (TOWEL DISPOSABLE) ×2 IMPLANT
UNDERPAD 30X30 INCONTINENT (UNDERPADS AND DIAPERS) ×2 IMPLANT
YANKAUER SUCT BULB TIP 10FT TU (MISCELLANEOUS) ×2 IMPLANT

## 2012-07-25 NOTE — Anesthesia Preprocedure Evaluation (Signed)
Anesthesia Evaluation  Patient identified by MRN, date of birth, ID band Patient awake    Reviewed: Allergy & Precautions, H&P , NPO status , Patient's Chart, lab work & pertinent test results  Airway Mallampati: I TM Distance: >3 FB Neck ROM: Full    Dental  (+) Teeth Intact   Pulmonary neg pulmonary ROS,  breath sounds clear to auscultation  Pulmonary exam normal       Cardiovascular negative cardio ROS  Rhythm:Regular     Neuro/Psych negative neurological ROS  negative psych ROS   GI/Hepatic negative GI ROS, Neg liver ROS,   Endo/Other  negative endocrine ROS  Renal/GU negative Renal ROS  negative genitourinary   Musculoskeletal negative musculoskeletal ROS (+)   Abdominal   Peds negative pediatric ROS (+)  Hematology negative hematology ROS (+)   Anesthesia Other Findings   Reproductive/Obstetrics negative OB ROS                           Anesthesia Physical Anesthesia Plan  ASA: I  Anesthesia Plan: General   Post-op Pain Management:    Induction: Intravenous  Airway Management Planned: LMA  Additional Equipment:   Intra-op Plan:   Post-operative Plan: Extubation in OR  Informed Consent: I have reviewed the patients History and Physical, chart, labs and discussed the procedure including the risks, benefits and alternatives for the proposed anesthesia with the patient or authorized representative who has indicated his/her understanding and acceptance.   Dental advisory given  Plan Discussed with: CRNA  Anesthesia Plan Comments:         Anesthesia Quick Evaluation

## 2012-07-25 NOTE — H&P (Signed)
Heidi Sanders   MRN: [id number]   Description: 51 year old female  Provider: Dortha Schwalbe., MD  Department: [facility]        Diagnoses     AIN grade I   - Primary    569.44      Reason for Visit     New Evaluation    Condyloma        Vitals - Last Recorded       BP Pulse Temp Resp Ht Wt    138/82 70 97.6 F (36.4 C) (Temporal) 14 5\' 3"  (1.6 m) 154 lb 2 oz (69.911 kg)         BMI              27.30 kg/m2                 Progress Notes   Patient ID: Heidi Sanders, female   DOB: 1961-06-04, 51 y.o.   MRN: [id number]    Chief Complaint   Patient presents with   .  New Evaluation       Condyloma      HPI Heidi Sanders is a 52 y.o. female.  HPIPatient sent S. request of Dr. Sharla Kidney due to a lesion removed in the anal canal during colonoscopy that showed AIN grade 1. Patient denies history of rectal bleeding, pain, discharge or history of anal condyloma.    Past Medical History   Diagnosis  Date   .  Depression     .  Hyperlipidemia     .  Condyloma         Past Surgical History   Procedure  Date   .  Bunionectomy  12/2010 &2002       left 2012             Right 2002   .  Hysteroscopy w/ endometrial ablation  2008       Family History   Problem  Relation  Age of Onset   .  Colon polyps  Sister     .  Colon polyps  Sister     .  Diabetes  Father        Social History History   Substance Use Topics   .  Smoking status:  Never Smoker    .  Smokeless tobacco:  Never Used   .  Alcohol Use:  No      No Known Allergies    Current Outpatient Prescriptions   Medication  Sig  Dispense  Refill   .  BLACK COHOSH EXTRACT PO  Take by mouth 3 (three) times daily.         .  NON FORMULARY  as needed. Assured pain relief PM         .  Soy Isoflavone 40 MG TABS  Take by mouth 3 (three) times daily.         Marland Kitchen  aspirin-acetaminophen-caffeine (EXCEDRIN MIGRAINE) 250-250-65 MG per tablet  Take 2 tablets by mouth every 6 (six) hours as  needed. headaches         .  Diphenhydramine-APAP, sleep, (EXCEDRIN PM PO)  Take 1 tablet by mouth at bedtime as needed. sleep         .  Mirtazapine (REMERON PO)  Take 1 tablet by mouth at bedtime as needed. depression            Review of Systems Review of Systems  Constitutional: Negative.   HENT:  Negative.   Eyes: Negative.   Respiratory: Negative.   Cardiovascular: Negative.   Gastrointestinal: Negative.   Genitourinary: Negative.   Musculoskeletal: Negative.   Neurological: Negative.   Hematological: Negative.   Psychiatric/Behavioral: Negative.     Blood pressure 138/82, pulse 70, temperature 97.6 F (36.4 C), temperature source Temporal, resp. rate 14, height 5\' 3"  (1.6 m), weight 154 lb 2 oz (69.911 kg).   Physical Exam Physical Exam  Constitutional: She is oriented to person, place, and time. She appears well-developed and well-nourished.  HENT:   Head: Normocephalic and atraumatic.  Eyes: EOM are normal. Pupils are equal, round, and reactive to light.  Neck: Normal range of motion. Neck supple.  Cardiovascular: Normal rate and regular rhythm.   Pulmonary/Chest: Effort normal and breath sounds normal.  Abdominal: Soft. Bowel sounds are normal.  Genitourinary:     Musculoskeletal: Normal range of motion.  Neurological: She is alert and oriented to person, place, and time.  Skin: Skin is warm and dry.  Psychiatric: She has a normal mood and affect. Her behavior is normal. Judgment and thought content normal.    Data Reviewed Colonoscopy report and  Path report   Assessment AIN grade 1 anal canal   Plan EUA  and biopsy if needed. Will need follow up after. Risks of bleeding,  Infection, organ injury,  More surgery and progression of disease.  She agrees to proceed.       Tylik Treese A. 07/25/2012

## 2012-07-25 NOTE — Anesthesia Postprocedure Evaluation (Signed)
  Anesthesia Post-op Note  Patient: Heidi Sanders  Procedure(s) Performed: Procedure(s) (LRB): EXAM UNDER ANESTHESIA (N/A)  Patient Location: PACU  Anesthesia Type: General  Level of Consciousness: awake and alert   Airway and Oxygen Therapy: Patient Spontanous Breathing  Post-op Pain: mild  Post-op Assessment: Post-op Vital signs reviewed, Patient's Cardiovascular Status Stable, Respiratory Function Stable, Patent Airway and No signs of Nausea or vomiting  Post-op Vital Signs: stable  Complications: No apparent anesthesia complications

## 2012-07-25 NOTE — Interval H&P Note (Signed)
History and Physical Interval Note:  07/25/2012 8:40 AM  Heidi Sanders  has presented today for surgery, with the diagnosis of anal intraepithelial neoplasia  The various methods of treatment have been discussed with the patient and family. After consideration of risks, benefits and other options for treatment, the patient has consented to  Procedure(s) (LRB): EXAM UNDER ANESTHESIA (N/A) as a surgical intervention .  The patient's history has been reviewed, patient examined, no change in status, stable for surgery.  I have reviewed the patient's chart and labs.  Questions were answered to the patient's satisfaction.     Allani Reber A.

## 2012-07-25 NOTE — Op Note (Signed)
Prep diagnosis: AIN of anal canal grade 1  Postoperative diagnosis: Same  Procedure: Exam under anesthesia with excision of 5 mm anal canal mass left lateral position  Surgeon: Harriette Bouillon M.D.    Anesthesia: LMA with Exparel local 20 cc with 20 cc of saline  EBL: Minimal  Specimen: Polyp lesion of anal canal to pathology  Drains: None  IV fluids: 800 cc crystalloid  Indications for procedure: The patient presents for exam under anesthesia after colonoscopy showed anal canal polyp as excised and found to show AIN low-grade. Recommended exam under anesthesia to further evaluate the anal canal and biopsy or excise any suspicious lesions as needed. Risk of bleeding, infection, need for further surgery, need for surveillance, to neighboring structures, difficulty with bowel movements, difficulty urination, and other procedures as risks. She understood and agreed to proceed.  Description of procedure: The patient was seen in the holding area and questions were answered. He is taken back to the operating room and placed supine where general anesthesia was initiated. He is placed in lithotomy in the anal canal and perianal region were prepped and draped in sterile fashion. She received 2 g of Ancef. External exam at timeout was done. No evidence of any external lesions. Digital examination was normal. Anoscopy is performed. The anoderm appeared normal. There was one 5 mm polypoid lesion in the left anal canal at the level dentate line that was excised with scissors. The stasis achieved with cautery. This was sent to pathology. The remainder of her anal canal was normal. Distal rectum appeared normal without mass lesion. Hemostasis achieved with Surgicel plug. Exparel used as perianal block. All final counts are found to be correct. The patient was taken to lithotomy extubated taken recovery in satisfactory condition.

## 2012-07-25 NOTE — Transfer of Care (Signed)
Immediate Anesthesia Transfer of Care Note  Patient: Heidi Sanders  Procedure(s) Performed: Procedure(s) (LRB): EXAM UNDER ANESTHESIA (N/A)  Patient Location: PACU  Anesthesia Type: General  Level of Consciousness: awake, alert , oriented and patient cooperative  Airway & Oxygen Therapy: Patient Spontanous Breathing and Patient connected to face mask oxygen  Post-op Assessment: Report given to PACU RN and Post -op Vital signs reviewed and stable  Post vital signs: stable  Complications: No apparent anesthesia complications

## 2012-07-26 ENCOUNTER — Encounter (INDEPENDENT_AMBULATORY_CARE_PROVIDER_SITE_OTHER): Payer: Self-pay

## 2012-07-26 ENCOUNTER — Encounter (HOSPITAL_COMMUNITY): Payer: Self-pay | Admitting: Surgery

## 2012-07-29 ENCOUNTER — Encounter (INDEPENDENT_AMBULATORY_CARE_PROVIDER_SITE_OTHER): Payer: Self-pay

## 2012-08-03 ENCOUNTER — Telehealth (INDEPENDENT_AMBULATORY_CARE_PROVIDER_SITE_OTHER): Payer: Self-pay | Admitting: General Surgery

## 2012-08-03 NOTE — Telephone Encounter (Signed)
PT CALLED RE PATHOLOGY RESULTS AND THE FACT THAT SHE HAS RETURNED TO WORK THIS WEEK AND HAS NOTED BLOOD ON OUTSIDE OF BOWEL MOVEMENTS FOR THE LAST SEVERAL DAYS, BRIGHT RED. NO BLEEDING BETWEEN BOWEL MOVEMENTS/ I REVIEWED THIS WITH DR. Luisa Hart AND HE EXPECTS SOME BLEEDING AS STOOL PASSES BY BIOPSY SITE. PT WILL CALL IF BLEEDING BETWEEN BOWEL MOVEMENTS OR IF INCREASED BLEEDING WITH BOWEL MOVEMENT/ PT AWARE OF RECOMMENDATION. I REVIEWED PATHOLOGY RESULTS WITH PATIENT ALSO/GY

## 2012-08-12 ENCOUNTER — Ambulatory Visit (INDEPENDENT_AMBULATORY_CARE_PROVIDER_SITE_OTHER): Payer: BC Managed Care – PPO | Admitting: Surgery

## 2012-08-12 ENCOUNTER — Encounter (INDEPENDENT_AMBULATORY_CARE_PROVIDER_SITE_OTHER): Payer: Self-pay | Admitting: Surgery

## 2012-08-12 VITALS — BP 124/80 | HR 67 | Temp 98.2°F | Ht 66.0 in | Wt 159.2 lb

## 2012-08-12 DIAGNOSIS — Z9889 Other specified postprocedural states: Secondary | ICD-10-CM

## 2012-08-12 NOTE — Patient Instructions (Signed)
Return in 6 months to see colorectal surgeon DR Maisie Fus for follow up of anal canal dysplasia

## 2012-08-12 NOTE — Progress Notes (Signed)
Patient returns after exam under anesthesia and excision of anal canal lesion. Final pathology showed AIN1/mild dysplasia. She's doing well.  Exam: Anal canal well healed. No signs of infection or skin lesion.  Impression: AIN1/mild dysplasia  Plan: Follow up 6 months with colorectal surgeon Dr. Maisie Fus.

## 2013-02-12 ENCOUNTER — Emergency Department (HOSPITAL_BASED_OUTPATIENT_CLINIC_OR_DEPARTMENT_OTHER)
Admission: EM | Admit: 2013-02-12 | Discharge: 2013-02-12 | Disposition: A | Discharge status: Home / Self Care | Payer: BC Managed Care – PPO | Attending: Emergency Medicine | Admitting: Emergency Medicine

## 2013-02-12 ENCOUNTER — Encounter (HOSPITAL_BASED_OUTPATIENT_CLINIC_OR_DEPARTMENT_OTHER): Payer: Self-pay

## 2013-02-12 ENCOUNTER — Emergency Department (HOSPITAL_BASED_OUTPATIENT_CLINIC_OR_DEPARTMENT_OTHER): Payer: BC Managed Care – PPO

## 2013-02-12 DIAGNOSIS — Y92410 Unspecified street and highway as the place of occurrence of the external cause: Secondary | ICD-10-CM | POA: Insufficient documentation

## 2013-02-12 DIAGNOSIS — Z79899 Other long term (current) drug therapy: Secondary | ICD-10-CM | POA: Insufficient documentation

## 2013-02-12 DIAGNOSIS — Z780 Asymptomatic menopausal state: Secondary | ICD-10-CM | POA: Insufficient documentation

## 2013-02-12 DIAGNOSIS — S8002XA Contusion of left knee, initial encounter: Secondary | ICD-10-CM

## 2013-02-12 DIAGNOSIS — Z8639 Personal history of other endocrine, nutritional and metabolic disease: Secondary | ICD-10-CM | POA: Insufficient documentation

## 2013-02-12 DIAGNOSIS — Z8659 Personal history of other mental and behavioral disorders: Secondary | ICD-10-CM | POA: Insufficient documentation

## 2013-02-12 DIAGNOSIS — S8000XA Contusion of unspecified knee, initial encounter: Secondary | ICD-10-CM | POA: Insufficient documentation

## 2013-02-12 DIAGNOSIS — Y9389 Activity, other specified: Secondary | ICD-10-CM | POA: Insufficient documentation

## 2013-02-12 DIAGNOSIS — Z8619 Personal history of other infectious and parasitic diseases: Secondary | ICD-10-CM | POA: Insufficient documentation

## 2013-02-12 DIAGNOSIS — Z862 Personal history of diseases of the blood and blood-forming organs and certain disorders involving the immune mechanism: Secondary | ICD-10-CM | POA: Insufficient documentation
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
## 2013-02-14 ENCOUNTER — Ambulatory Visit (INDEPENDENT_AMBULATORY_CARE_PROVIDER_SITE_OTHER): Payer: BC Managed Care – PPO | Admitting: General Surgery

## 2013-02-14 VITALS — BP 150/100 | HR 60 | Temp 96.1°F | Resp 18 | Ht 66.0 in | Wt 161.6 lb

## 2013-02-14 DIAGNOSIS — K6282 Dysplasia of anus: Secondary | ICD-10-CM

## 2013-02-14 DIAGNOSIS — N10 Acute tubulo-interstitial nephritis: Secondary | ICD-10-CM | POA: Insufficient documentation

## 2013-02-14 NOTE — Patient Instructions (Signed)
We will schedule a high resolution anoscopy to evaluate for any more dysplasia.

## 2013-02-14 NOTE — Progress Notes (Signed)
No chief complaint on file.   HISTORY:  Heidi Sanders is a 52 y.o. female who presents to clinic with a h/o AIN on anal canal biopsy 6 months ago.  She is here to discuss further management.  She denies any anal pain, bleeding or itching.  She occasionally gets constipated and takes miralax for this.    Past Medical History  Diagnosis Date  . Depression   . Hyperlipidemia   . Condyloma   . Abnormal colonoscopy     polyps removed and abnormal lesions seen but could not be removed  . Complication of anesthesia     PT TOLD THAT HER B/P DROPPED DURING HER COLONOSCOPY  . Postmenopausal     PT STATES HER LAST PERIOD WAS OVER 1 YEAR AGO--SHE IS ON CLIMARA PATCH       Past Surgical History  Procedure Laterality Date  . Bunionectomy  12/2010 &2002    left 2012             Right 2002  . Hysteroscopy w/ endometrial ablation  2008  . Examination under anesthesia  07/25/2012    Procedure: EXAM UNDER ANESTHESIA;  Surgeon: Maisie Fus A. Cornett, MD;  Location: [facility];  Service: General;  Laterality: N/A;  anal intraepithelial neoplasia      Current Outpatient Prescriptions  Medication Sig Dispense Refill  . aspirin-acetaminophen-caffeine (EXCEDRIN MIGRAINE) 250-250-65 MG per tablet Take 2 tablets by mouth every 6 (six) hours as needed. headaches      . Diphenhydramine-APAP, sleep, (EXCEDRIN PM PO) Take 1 tablet by mouth at bedtime as needed. sleep      . Multiple Vitamins-Minerals (HAIR/SKIN/NAILS PO) Take 1 tablet by mouth daily.      . polyethylene glycol (MIRALAX / GLYCOLAX) packet Take 17 g by mouth daily.       No current facility-administered medications for this visit.     No Known Allergies    Family History  Problem Relation Age of Onset  . Colon polyps Sister   . Colon polyps Sister   . Diabetes Father       History   Social History  . Marital Status: Divorced    Spouse Name: N/A    Number of Children: N/A  . Years of Education: N/A   Social History Main Topics  . Smoking  status: Never Smoker   . Smokeless tobacco: Never Used  . Alcohol Use: No  . Drug Use: No  . Sexually Active: No   Other Topics Concern  . Not on file   Social History Narrative  . No narrative on file       REVIEW OF SYSTEMS - PERTINENT POSITIVES ONLY: Review of Systems - General ROS: negative for - chills or fever Respiratory ROS: no cough, shortness of breath, or wheezing Cardiovascular ROS: no chest pain or dyspnea on exertion Gastrointestinal ROS: no abdominal pain, change in bowel habits, or black or bloody stools  EXAM: Filed Vitals:   02/14/13 0926  BP: 150/100  Pulse: 60  Temp: 96.1 F (35.6 C)  Resp: 18    General appearance: alert and cooperative Resp: clear to auscultation bilaterally Cardio: regular rate and rhythm GI: soft, non-tender; bowel sounds normal; no masses,  no organomegaly    ASSESSMENT AND PLAN: Heidi Sanders is a 52 y.o. F who underwent an EUA and anal canal biopsy 6 mo ago by Dr Luisa Hart.  This showed low grade AIN.  I was asked to evaluate her.  I discussed the natural pathology of  these lesions.  I offered her HRA vs waiting and watching clinically.  She would like to proceed with HRA.  We will get this scheduled at her convenience.       Vanita Panda, MD Colon and Rectal Surgery / General Surgery [facility]      Visit Diagnoses: 1. AIN grade I     Primary Care Physician: Gaspar Garbe, MD

## 2013-05-02 ENCOUNTER — Other Ambulatory Visit (HOSPITAL_COMMUNITY): Payer: Self-pay | Admitting: Obstetrics

## 2013-05-02 DIAGNOSIS — Z1231 Encounter for screening mammogram for malignant neoplasm of breast: Secondary | ICD-10-CM

## 2013-05-11 ENCOUNTER — Ambulatory Visit (HOSPITAL_COMMUNITY)
Admission: RE | Admit: 2013-05-11 | Discharge: 2013-05-11 | Disposition: A | Discharge status: Home / Self Care | Payer: BC Managed Care – PPO | Source: Ambulatory Visit | Attending: Obstetrics | Admitting: Obstetrics

## 2013-05-11 DIAGNOSIS — Z1231 Encounter for screening mammogram for malignant neoplasm of breast: Secondary | ICD-10-CM | POA: Insufficient documentation

## 2014-03-15 ENCOUNTER — Other Ambulatory Visit (HOSPITAL_COMMUNITY): Payer: Self-pay | Admitting: Obstetrics

## 2014-03-15 DIAGNOSIS — Z1231 Encounter for screening mammogram for malignant neoplasm of breast: Secondary | ICD-10-CM

## 2014-04-02 ENCOUNTER — Ambulatory Visit (HOSPITAL_COMMUNITY)
Admission: RE | Admit: 2014-04-02 | Discharge: 2014-04-02 | Disposition: A | Discharge status: Home / Self Care | Payer: BC Managed Care – PPO | Source: Ambulatory Visit | Attending: Obstetrics | Admitting: Obstetrics

## 2014-04-02 DIAGNOSIS — Z1231 Encounter for screening mammogram for malignant neoplasm of breast: Secondary | ICD-10-CM | POA: Insufficient documentation

## 2014-05-15 ENCOUNTER — Ambulatory Visit (HOSPITAL_COMMUNITY): Payer: No Typology Code available for payment source

## 2014-09-14 ENCOUNTER — Telehealth: Payer: Self-pay | Admitting: Internal Medicine

## 2014-09-14 NOTE — Telephone Encounter (Signed)
This is a surgical problem (AIN, anal canal neoplasia) and she needs to be referred back to surgery, rather than GI

## 2014-09-14 NOTE — Telephone Encounter (Signed)
Patient had a colonoscopy with you in 2013.  In 2014 she went to see Dr. Marcello Moores and was planning on having a surgery for :  ASSESSMENT AND PLAN:  Shawna Wearing is a 53 y.o. F who underwent an EUA and anal canal biopsy 6 mo ago by Dr Brantley Stage. This showed low grade AIN. I was asked to evaluate her. I discussed the natural pathology of these lesions. I offered her HRA vs waiting and watching clinically. She would like to proceed with HRA. We will get this scheduled at her convenience.    Patient never followed up with Dr. Marcello Moores.  Dr. Osborne Casco is sending her here for evaluation of the above.   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 Aldona Bar MD Pathologist, Electronic Signature (Case signed 08/06/  I set her up for 11/20/14.  Do you need to see her earlier???

## 2014-09-14 NOTE — Telephone Encounter (Signed)
Kim at Dr. Loren Racer office notified appt for Dec 1 cancelled.  Kim aware

## 2014-11-20 ENCOUNTER — Ambulatory Visit: Payer: BC Managed Care – PPO | Admitting: Internal Medicine

## 2016-08-24 ENCOUNTER — Emergency Department (HOSPITAL_BASED_OUTPATIENT_CLINIC_OR_DEPARTMENT_OTHER)
Admission: EM | Admit: 2016-08-24 | Discharge: 2016-08-24 | Disposition: A | Discharge status: Home / Self Care | Payer: No Typology Code available for payment source | Attending: Emergency Medicine | Admitting: Emergency Medicine

## 2016-08-24 ENCOUNTER — Encounter (HOSPITAL_BASED_OUTPATIENT_CLINIC_OR_DEPARTMENT_OTHER): Payer: Self-pay | Admitting: Emergency Medicine

## 2016-08-24 ENCOUNTER — Emergency Department (HOSPITAL_BASED_OUTPATIENT_CLINIC_OR_DEPARTMENT_OTHER): Payer: No Typology Code available for payment source

## 2016-08-24 DIAGNOSIS — S8992XA Unspecified injury of left lower leg, initial encounter: Secondary | ICD-10-CM

## 2016-08-24 DIAGNOSIS — Y939 Activity, unspecified: Secondary | ICD-10-CM | POA: Insufficient documentation

## 2016-08-24 DIAGNOSIS — Y999 Unspecified external cause status: Secondary | ICD-10-CM | POA: Insufficient documentation

## 2016-08-24 DIAGNOSIS — W010XXA Fall on same level from slipping, tripping and stumbling without subsequent striking against object, initial encounter: Secondary | ICD-10-CM | POA: Insufficient documentation

## 2016-08-24 DIAGNOSIS — Y929 Unspecified place or not applicable: Secondary | ICD-10-CM | POA: Insufficient documentation

## 2016-08-24 MED ORDER — IBUPROFEN 800 MG PO TABS: 800.0000 mg | ORAL_TABLET | Freq: Three times a day (TID) | ORAL | 0 refills | Status: AC

## 2016-08-24 MED ORDER — IBUPROFEN 400 MG PO TABS
600.0000 mg | ORAL_TABLET | Freq: Once | ORAL | Status: AC
  Administered 2016-08-24: 600 mg via ORAL
  Filled 2016-08-24: qty 1

## 2016-08-24 NOTE — ED Notes (Signed)
EMT at bedside to apply knee sleeve

## 2016-08-24 NOTE — ED Provider Notes (Signed)
[facility] Provider Note   CSN: [id number] Arrival date & time: 08/24/16  0931     History   Chief Complaint Chief Complaint  Patient presents with  . Knee Pain    left    HPI Heidi Sanders is a 55 y.o. female.  HPI   Heidi [facility] is a 55 y.o. female, patient with no pertinent past medical history, presenting to the ED with A left knee injury occurring 5 days ago. Patient states she tripped, fell, and landed on her left knee on a hardwood floor. Pain is mild to moderate, worse with ambulation and movement, aching/burning in nature, nonradiating. Has been taking Tylenol with some relief. Denies neuro deficits, neck/back pain, head trauma, or any other complaints.    Past Medical History:  Diagnosis Date  . Abnormal colonoscopy    polyps removed and abnormal lesions seen but could not be removed  . Complication of anesthesia    PT TOLD THAT HER B/P DROPPED DURING HER COLONOSCOPY  . Condyloma   . Depression   . Hyperlipidemia   . Postmenopausal    PT STATES HER LAST PERIOD WAS OVER 1 YEAR AGO--SHE IS ON [place] PATCH    Patient Active Problem List   Diagnosis Date Noted  . 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 Post-operative state 08/12/2012    Past Surgical History:  Procedure Laterality Date  . BUNIONECTOMY  12/2010 &2002   left 2012             Right 2002  . EXAMINATION UNDER ANESTHESIA  07/25/2012   Procedure: EXAM UNDER ANESTHESIA;  Surgeon: Marcello Moores A. Cornett, MD;  Location: [facility];  Service: General;  Laterality: N/A;  anal intraepithelial neoplasia  . HYSTEROSCOPY W/ ENDOMETRIAL ABLATION  2008    OB History    No data available       Home Medications    Prior to Admission medications   Medication Sig Start Date End Date Taking? Authorizing Provider  aspirin-acetaminophen-caffeine (EXCEDRIN MIGRAINE) [phone] MG per tablet Take 2 tablets by mouth every 6 (six) hours as needed. headaches    Historical Provider, MD    Diphenhydramine-APAP, sleep, (EXCEDRIN PM PO) Take 1 tablet by mouth at bedtime as needed. sleep    Historical Provider, MD  ibuprofen (ADVIL,MOTRIN) 800 MG tablet Take 1 tablet (800 mg total) by mouth 3 (three) times daily. 08/24/16   Isabel Ardila C Gionna Polak, PA-C  Multiple Vitamins-Minerals (HAIR/SKIN/NAILS PO) Take 1 tablet by mouth daily.    Historical Provider, MD  polyethylene glycol (MIRALAX / GLYCOLAX) packet Take 17 g by mouth daily.    Historical Provider, MD    Family History Family History  Problem Relation Age of Onset  . Colon polyps Sister   . Colon polyps Sister   . Diabetes Father     Social History Social History  Substance Use Topics  . Smoking status: Never Smoker  . Smokeless tobacco: Never Used  . Alcohol use No     Allergies   Review of patient's allergies indicates no known allergies.   Review of Systems Review of Systems  Musculoskeletal: Positive for arthralgias.  Neurological: Negative for weakness and numbness.     Physical Exam Updated Vital Signs BP 163/91 (BP Location: Right Arm)   Pulse 69   Temp 98 F (36.7 C) (Oral)   Resp 18   Ht 5\' 6"  (1.676 m)   Wt 77.1 kg   SpO2 97%   BMI 27.44 kg/m   Physical Exam  Constitutional: She appears well-developed and well-nourished. No distress.  HENT:  Head: Normocephalic and atraumatic.  Eyes: Conjunctivae are normal.  Neck: Neck supple.  Cardiovascular: Normal rate and regular rhythm.   Pulmonary/Chest: Effort normal.  Musculoskeletal: Normal range of motion.  Full passive and active range of motion in bilateral lower extremities. Some mild crepitus in the left knee with range of motion. No effusion, laxity, or deformity noted. Patient is weightbearing without assistance.  Neurological: She is alert.  Strength is 5 out of 5 in the bilateral lower extremities. No sensory deficits.  Skin: Skin is warm and dry. She is not diaphoretic.  Psychiatric: She has a normal mood and affect. Her behavior is  normal.  Nursing note and vitals reviewed.    ED Treatments / Results  Labs (all labs ordered are listed, but only abnormal results are displayed) Labs Reviewed - No data to display  EKG  EKG Interpretation None       Radiology Dg Knee Complete 4 Views Left  Result Date: 08/24/2016 CLINICAL DATA:  Fall onto knee 5 days ago. Left knee pain and swelling. Initial encounter. EXAM: LEFT KNEE - COMPLETE 4+ VIEW COMPARISON:  02/12/2013 FINDINGS: No evidence of fracture, dislocation, or joint effusion. No evidence of knee joint arthritis. 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 IMPRESSION: No acute findings. Electronically Signed   By: Earle Gell M.D.   On: 08/24/2016 10:10    Procedures Procedures (including critical care time)  Medications Ordered in ED Medications  ibuprofen (ADVIL,MOTRIN) tablet 600 mg (600 mg Oral Given 08/24/16 1014)     Initial Impression / Assessment and Plan / ED Course  I have reviewed the triage vital signs and the nursing notes.  Pertinent labs & imaging results that were available during my care of the patient were reviewed by me and considered in my medical decision making (see chart for details).  Clinical Course    Heidi Sanders presents with a left knee injury that occurred 5 days ago. No abnormalities on x-ray. Knee sleeve and orthopedic referral.   Final Clinical Impressions(s) / ED Diagnoses   Final diagnoses:  Knee injury, left, initial encounter    New Prescriptions New Prescriptions   IBUPROFEN (ADVIL,MOTRIN) 800 MG TABLET    Take 1 tablet (800 mg total) by mouth 3 (three) times daily.     Lorayne Bender, PA-C 08/24/16 [place], MD 08/24/16 [phone]

## 2016-08-24 NOTE — Discharge Instructions (Signed)
No abnormalities on the x-ray. Weight-bearing as tolerated. Follow-up with orthopedics as needed should symptoms fail to resolve.

## 2016-08-24 NOTE — ED Triage Notes (Signed)
Patient reports that she fell a few days ago and hurt her left knee.

## 2016-09-18 LAB — PROCEDURE REPORT - SCANNED: Pap: NEGATIVE

## 2017-01-28 ENCOUNTER — Other Ambulatory Visit: Payer: Self-pay | Admitting: Internal Medicine

## 2017-01-28 DIAGNOSIS — Z1231 Encounter for screening mammogram for malignant neoplasm of breast: Secondary | ICD-10-CM

## 2017-03-17 ENCOUNTER — Ambulatory Visit: Payer: No Typology Code available for payment source

## 2017-05-14 ENCOUNTER — Encounter: Payer: Self-pay | Admitting: Internal Medicine

## 2017-05-20 ENCOUNTER — Encounter: Payer: Self-pay | Admitting: Internal Medicine

## 2017-06-01 ENCOUNTER — Ambulatory Visit: Payer: BLUE CROSS/BLUE SHIELD | Admitting: *Deleted

## 2017-06-01 VITALS — Ht 66.0 in | Wt 179.0 lb

## 2017-06-01 DIAGNOSIS — Z86010 Personal history of colonic polyps: Secondary | ICD-10-CM

## 2017-06-01 MED ORDER — NA SULFATE-K SULFATE-MG SULF 17.5-3.13-1.6 GM/177ML PO SOLN: 1.0000 | Freq: Once | ORAL | 0 refills | Status: AC

## 2017-06-01 NOTE — Progress Notes (Signed)
Denies allergies to eggs or soy products. Denies complications with sedation or anesthesia. Denies O2 use. Denies use of diet or weight loss medications.  Emmi instructions given for colonoscopy.  

## 2017-06-04 DIAGNOSIS — R8299 Other abnormal findings in urine: Secondary | ICD-10-CM | POA: Diagnosis not present

## 2017-06-04 DIAGNOSIS — N390 Urinary tract infection, site not specified: Secondary | ICD-10-CM | POA: Diagnosis not present

## 2017-06-04 DIAGNOSIS — Z0000 Encounter for general adult medical examination without abnormal findings: Secondary | ICD-10-CM | POA: Diagnosis not present

## 2017-06-09 DIAGNOSIS — Z01419 Encounter for gynecological examination (general) (routine) without abnormal findings: Secondary | ICD-10-CM | POA: Diagnosis not present

## 2017-06-09 DIAGNOSIS — Z1151 Encounter for screening for human papillomavirus (HPV): Secondary | ICD-10-CM | POA: Diagnosis not present

## 2017-06-09 DIAGNOSIS — Z1231 Encounter for screening mammogram for malignant neoplasm of breast: Secondary | ICD-10-CM | POA: Diagnosis not present

## 2017-06-09 DIAGNOSIS — Z6829 Body mass index (BMI) 29.0-29.9, adult: Secondary | ICD-10-CM | POA: Diagnosis not present

## 2017-06-11 ENCOUNTER — Encounter: Payer: Self-pay | Admitting: Internal Medicine

## 2017-06-11 DIAGNOSIS — Z0000 Encounter for general adult medical examination without abnormal findings: Secondary | ICD-10-CM | POA: Diagnosis not present

## 2017-06-11 DIAGNOSIS — C210 Malignant neoplasm of anus, unspecified: Secondary | ICD-10-CM | POA: Diagnosis not present

## 2017-06-11 DIAGNOSIS — D126 Benign neoplasm of colon, unspecified: Secondary | ICD-10-CM | POA: Diagnosis not present

## 2017-06-11 DIAGNOSIS — E7800 Pure hypercholesterolemia, unspecified: Secondary | ICD-10-CM | POA: Diagnosis not present

## 2017-06-11 DIAGNOSIS — Z1212 Encounter for screening for malignant neoplasm of rectum: Secondary | ICD-10-CM | POA: Diagnosis not present

## 2017-06-11 DIAGNOSIS — Z1389 Encounter for screening for other disorder: Secondary | ICD-10-CM | POA: Diagnosis not present

## 2017-06-11 DIAGNOSIS — Z800 Family history of malignant neoplasm of digestive organs: Secondary | ICD-10-CM | POA: Diagnosis not present

## 2017-06-15 ENCOUNTER — Encounter: Payer: No Typology Code available for payment source | Admitting: Internal Medicine

## 2017-06-25 ENCOUNTER — Ambulatory Visit (AMBULATORY_SURGERY_CENTER): Payer: BLUE CROSS/BLUE SHIELD | Admitting: Internal Medicine

## 2017-06-25 VITALS — BP 101/76 | HR 56 | Temp 98.0°F | Resp 18 | Ht 66.0 in | Wt 179.0 lb

## 2017-06-25 DIAGNOSIS — D125 Benign neoplasm of sigmoid colon: Secondary | ICD-10-CM

## 2017-06-25 DIAGNOSIS — K635 Polyp of colon: Secondary | ICD-10-CM | POA: Diagnosis not present

## 2017-06-25 DIAGNOSIS — Z86010 Personal history of colonic polyps: Secondary | ICD-10-CM | POA: Diagnosis not present

## 2017-06-25 MED ORDER — SODIUM CHLORIDE 0.9 % IV SOLN: 500.0000 mL | INTRAVENOUS | Status: AC

## 2017-06-25 NOTE — Op Note (Signed)
[facility] Patient Name: Heidi Sanders Procedure Date: 06/25/2017 1:58 PM MRN: [id number] Endoscopist: Jerene Bears , MD Age: 56 Referring MD:  Date of Birth: [birth year] Gender: Female Account #: [id number] Procedure:                Colonoscopy Indications:              Surveillance: Personal history of adenomatous                            polyps on last colonoscopy 5 years ago, also                            history of AIN-I s/p surgical resection in 2013 Medicines:                Monitored Anesthesia Care Procedure:                Pre-Anesthesia Assessment:                           - Prior to the procedure, a History and Physical                            was performed, and patient medications and                            allergies were reviewed. The patient's tolerance of                            previous anesthesia was also reviewed. The risks                            and benefits of the procedure and the sedation                            options and risks were discussed with the patient.                            All questions were answered, and informed consent                            was obtained. Prior Anticoagulants: The patient has                            taken no previous anticoagulant or antiplatelet                            agents. ASA Grade Assessment: II - A patient with                            mild systemic disease. After reviewing the risks                            and benefits, the patient was deemed in  satisfactory condition to undergo the procedure.                           After obtaining informed consent, the colonoscope                            was passed under direct vision. Throughout the                            procedure, the patient's blood pressure, pulse, and                            oxygen saturations were monitored continuously. The                            Colonoscope was  introduced through the anus and                            advanced to the the cecum, identified by                            appendiceal orifice and ileocecal valve. The                            colonoscopy was performed without difficulty. The                            patient tolerated the procedure well. The quality                            of the bowel preparation was good. The ileocecal                            valve, appendiceal orifice, and rectum were                            photographed. Scope In: 2:03:11 PM Scope Out: 2:23:05 PM Scope Withdrawal Time: 0 hours 11 minutes 49 seconds  Total Procedure Duration: 0 hours 19 minutes 54 seconds  Findings:                 The digital rectal exam was normal.                           A 6 mm polyp was found in the sigmoid colon. The                            polyp was pedunculated. The polyp was removed with                            a hot snare. Resection and retrieval were complete.                           The exam was otherwise without abnormality on  direct and retroflexion views. Complications:            No immediate complications. Estimated Blood Loss:     Estimated blood loss: none. Impression:               - One 6 mm polyp in the sigmoid colon, removed with                            a hot snare. Resected and retrieved.                           - The examination was otherwise normal on direct                            and retroflexion views. Recommendation:           - Patient has a contact number available for                            emergencies. The signs and symptoms of potential                            delayed complications were discussed with the                            patient. Return to normal activities tomorrow.                            Written discharge instructions were provided to the                            patient.                           - Resume  previous diet.                           - Continue present medications.                           - Await pathology results.                           - No ibuprofen, naproxen, or other non-steroidal                            anti-inflammatory drugs for 2 weeks after polyp                            removal.                           - Await pathology results.                           - Repeat colonoscopy is recommended. The  colonoscopy date will be determined after pathology                            results from today's exam become available for                            review. Jerene Bears, MD 06/25/2017 2:27:00 PM This report has been signed electronically.

## 2017-06-25 NOTE — Progress Notes (Signed)
Pt's states no medical or surgical changes since previsit or office visit. 

## 2017-06-25 NOTE — Patient Instructions (Signed)
YOU HAD AN ENDOSCOPIC PROCEDURE TODAY AT [facility]:   Refer to the procedure report that was given to you for any specific questions about what was found during the examination.  If the procedure report does not answer your questions, please call your gastroenterologist to clarify.  If you requested that your care partner not be given the details of your procedure findings, then the procedure report has been included in a sealed envelope for you to review at your convenience later.  YOU SHOULD EXPECT: Some feelings of bloating in the abdomen. Passage of more gas than usual.  Walking can help get rid of the air that was put into your GI tract during the procedure and reduce the bloating. If you had a lower endoscopy (such as a colonoscopy or flexible sigmoidoscopy) you may notice spotting of blood in your stool or on the toilet paper. If you underwent a bowel prep for your procedure, you may not have a normal bowel movement for a few days.  Please Note:  You might notice some irritation and congestion in your nose or some drainage.  This is from the oxygen used during your procedure.  There is no need for concern and it should clear up in a day or so.  SYMPTOMS TO REPORT IMMEDIATELY:   Following lower endoscopy (colonoscopy or flexible sigmoidoscopy):  Excessive amounts of blood in the stool  Significant tenderness or worsening of abdominal pains  Swelling of the abdomen that is new, acute  Fever of 100F or higher   Black, tarry-looking stools  For urgent or emergent issues, a gastroenterologist can be reached at any hour by calling [phone].  No NSAIDS, ibuprofren, motrin or aleve for 2 weeks.  Please read all handouts given to you by your recovery nurse.   DIET:  We do recommend a small meal at first, but then you may proceed to your regular diet.  Drink plenty of fluids but you should avoid alcoholic beverages for 24 hours.  ACTIVITY:  You should plan to take it  easy for the rest of today and you should NOT DRIVE or use heavy machinery until tomorrow (because of the sedation medicines used during the test).    FOLLOW UP: Our staff will call the number listed on your records the next business day following your procedure to check on you and address any questions or concerns that you may have regarding the information given to you following your procedure. If we do not reach you, we will leave a message.  However, if you are feeling well and you are not experiencing any problems, there is no need to return our call.  We will assume that you have returned to your regular daily activities without incident.  If any biopsies were taken you will be contacted by phone or by letter within the next 1-3 weeks.  Please call us at [phone] if you have not heard about the biopsies in 3 weeks.    SIGNATURES/CONFIDENTIALITY: You and/or your care partner have signed paperwork which will be entered into your electronic medical record.  These signatures attest to the fact that that the information above on your After Visit Summary has been reviewed and is understood.  Full responsibility of the confidentiality of this discharge information lies with you and/or your care-partner.  Thank you for letting us take care of your healthcare needs today.

## 2017-06-25 NOTE — Progress Notes (Signed)
Report to PACU, RN, vss, BBS= Clear.  

## 2017-06-25 NOTE — Progress Notes (Signed)
Called to room to assist during endoscopic procedure.  Patient ID and intended procedure confirmed with present staff. Received instructions for my participation in the procedure from the performing physician.  

## 2017-06-28 ENCOUNTER — Telehealth: Payer: Self-pay | Admitting: *Deleted

## 2017-06-28 NOTE — Telephone Encounter (Signed)
No answer for follow up will call back later this afternoon. Sm

## 2017-06-28 NOTE — Telephone Encounter (Signed)
  Follow up Call-  Call back number 06/25/2017  Post procedure Call Back phone  # [phone]  Permission to leave phone message Yes  Some recent data might be hidden     Patient questions:  Do you have a fever, pain , or abdominal swelling? No. Pain Score  0 *  Have you tolerated food without any problems? Yes.    Have you been able to return to your normal activities? Yes.    Do you have any questions about your discharge instructions: Diet   No. Medications  No. Follow up visit  No.  Do you have questions or concerns about your Care? No.  Actions: * If pain score is 4 or above: No action needed, pain <4.

## 2017-07-01 ENCOUNTER — Encounter: Payer: Self-pay | Admitting: Internal Medicine

## 2017-08-18 DIAGNOSIS — L292 Pruritus vulvae: Secondary | ICD-10-CM | POA: Diagnosis not present

## 2017-09-21 DIAGNOSIS — Z720 Tobacco use: Secondary | ICD-10-CM | POA: Diagnosis not present

## 2017-09-21 DIAGNOSIS — D126 Benign neoplasm of colon, unspecified: Secondary | ICD-10-CM | POA: Diagnosis not present

## 2017-09-21 DIAGNOSIS — A630 Anogenital (venereal) warts: Secondary | ICD-10-CM | POA: Diagnosis not present

## 2017-09-21 DIAGNOSIS — K5909 Other constipation: Secondary | ICD-10-CM | POA: Diagnosis not present

## 2017-11-23 DIAGNOSIS — Z111 Encounter for screening for respiratory tuberculosis: Secondary | ICD-10-CM | POA: Diagnosis not present

## 2017-12-23 DIAGNOSIS — R52 Pain, unspecified: Secondary | ICD-10-CM | POA: Diagnosis not present

## 2017-12-23 DIAGNOSIS — A630 Anogenital (venereal) warts: Secondary | ICD-10-CM | POA: Diagnosis not present

## 2017-12-23 DIAGNOSIS — D2239 Melanocytic nevi of other parts of face: Secondary | ICD-10-CM | POA: Diagnosis not present

## 2017-12-23 DIAGNOSIS — D224 Melanocytic nevi of scalp and neck: Secondary | ICD-10-CM | POA: Diagnosis not present

## 2017-12-23 DIAGNOSIS — K5909 Other constipation: Secondary | ICD-10-CM | POA: Diagnosis not present

## 2017-12-23 DIAGNOSIS — Z720 Tobacco use: Secondary | ICD-10-CM | POA: Diagnosis not present

## 2017-12-23 DIAGNOSIS — D126 Benign neoplasm of colon, unspecified: Secondary | ICD-10-CM | POA: Diagnosis not present

## 2018-01-26 DIAGNOSIS — R635 Abnormal weight gain: Secondary | ICD-10-CM | POA: Diagnosis not present

## 2018-01-26 DIAGNOSIS — R5383 Other fatigue: Secondary | ICD-10-CM | POA: Diagnosis not present

## 2018-01-26 DIAGNOSIS — R0602 Shortness of breath: Secondary | ICD-10-CM | POA: Diagnosis not present

## 2018-06-08 DIAGNOSIS — Z0000 Encounter for general adult medical examination without abnormal findings: Secondary | ICD-10-CM | POA: Diagnosis not present

## 2018-06-14 DIAGNOSIS — Z1212 Encounter for screening for malignant neoplasm of rectum: Secondary | ICD-10-CM | POA: Diagnosis not present

## 2018-06-21 DIAGNOSIS — Z1389 Encounter for screening for other disorder: Secondary | ICD-10-CM | POA: Diagnosis not present

## 2018-06-21 DIAGNOSIS — J45998 Other asthma: Secondary | ICD-10-CM | POA: Diagnosis not present

## 2018-06-21 DIAGNOSIS — Z0000 Encounter for general adult medical examination without abnormal findings: Secondary | ICD-10-CM | POA: Diagnosis not present

## 2018-06-22 ENCOUNTER — Other Ambulatory Visit: Payer: Self-pay | Admitting: Internal Medicine

## 2018-06-22 DIAGNOSIS — Z1231 Encounter for screening mammogram for malignant neoplasm of breast: Secondary | ICD-10-CM

## 2018-08-24 ENCOUNTER — Other Ambulatory Visit: Payer: Self-pay

## 2018-08-24 ENCOUNTER — Emergency Department (HOSPITAL_BASED_OUTPATIENT_CLINIC_OR_DEPARTMENT_OTHER)
Admission: EM | Admit: 2018-08-24 | Discharge: 2018-08-24 | Disposition: A | Discharge status: Home / Self Care | Payer: BLUE CROSS/BLUE SHIELD | Attending: Emergency Medicine | Admitting: Emergency Medicine

## 2018-08-24 ENCOUNTER — Encounter (HOSPITAL_BASED_OUTPATIENT_CLINIC_OR_DEPARTMENT_OTHER): Payer: Self-pay | Admitting: Emergency Medicine

## 2018-08-24 ENCOUNTER — Other Ambulatory Visit (HOSPITAL_BASED_OUTPATIENT_CLINIC_OR_DEPARTMENT_OTHER): Payer: Self-pay | Admitting: Radiology

## 2018-08-24 ENCOUNTER — Emergency Department (HOSPITAL_BASED_OUTPATIENT_CLINIC_OR_DEPARTMENT_OTHER): Payer: BLUE CROSS/BLUE SHIELD

## 2018-08-24 DIAGNOSIS — Z79899 Other long term (current) drug therapy: Secondary | ICD-10-CM | POA: Insufficient documentation

## 2018-08-24 DIAGNOSIS — E785 Hyperlipidemia, unspecified: Secondary | ICD-10-CM | POA: Diagnosis not present

## 2018-08-24 DIAGNOSIS — R519 Headache, unspecified: Secondary | ICD-10-CM

## 2018-08-24 DIAGNOSIS — S0990XA Unspecified injury of head, initial encounter: Secondary | ICD-10-CM | POA: Diagnosis not present

## 2018-08-24 DIAGNOSIS — R51 Headache: Secondary | ICD-10-CM | POA: Diagnosis not present

## 2018-08-24 MED ORDER — ACETAMINOPHEN 500 MG PO TABS
1000.0000 mg | ORAL_TABLET | Freq: Once | ORAL | Status: AC
  Administered 2018-08-24: 1000 mg via ORAL
  Filled 2018-08-24: qty 2

## 2018-08-24 MED ORDER — METOCLOPRAMIDE HCL 10 MG PO TABS: 10.0000 mg | ORAL_TABLET | Freq: Four times a day (QID) | ORAL | 0 refills | Status: AC | PRN

## 2018-08-24 MED ORDER — VALPROIC ACID 250 MG PO CAPS
500.0000 mg | ORAL_CAPSULE | ORAL | Status: AC
  Administered 2018-08-24: 500 mg via ORAL
  Filled 2018-08-24: qty 2

## 2018-08-24 MED ORDER — KETOROLAC TROMETHAMINE 30 MG/ML IJ SOLN
15.0000 mg | Freq: Once | INTRAMUSCULAR | Status: AC
  Administered 2018-08-24: 15 mg via INTRAVENOUS
  Filled 2018-08-24: qty 1

## 2018-08-24 MED ORDER — SODIUM CHLORIDE 0.9 % IV BOLUS
500.0000 mL | Freq: Once | INTRAVENOUS | Status: AC
  Administered 2018-08-24: 500 mL via INTRAVENOUS

## 2018-08-24 MED ORDER — DIVALPROEX SODIUM 500 MG PO DR TAB
500.0000 mg | DELAYED_RELEASE_TABLET | Freq: Two times a day (BID) | ORAL | Status: DC
  Filled 2018-08-24: qty 1

## 2018-08-24 MED ORDER — DEXAMETHASONE SODIUM PHOSPHATE 10 MG/ML IJ SOLN
10.0000 mg | Freq: Once | INTRAMUSCULAR | Status: AC
  Administered 2018-08-24: 10 mg via INTRAVENOUS
  Filled 2018-08-24: qty 1

## 2018-08-24 MED FILL — METOCLOPRAMIDE 10 MG TABLET: 10 | 2 days supply | Qty: 6 | Fill #0
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
## 2019-01-20 DIAGNOSIS — L910 Hypertrophic scar: Secondary | ICD-10-CM | POA: Diagnosis not present

## 2019-01-24 DIAGNOSIS — Z111 Encounter for screening for respiratory tuberculosis: Secondary | ICD-10-CM | POA: Diagnosis not present

## 2019-02-28 ENCOUNTER — Ambulatory Visit: Payer: BLUE CROSS/BLUE SHIELD

## 2019-03-01 DIAGNOSIS — Z124 Encounter for screening for malignant neoplasm of cervix: Secondary | ICD-10-CM | POA: Diagnosis not present

## 2019-03-03 DIAGNOSIS — L910 Hypertrophic scar: Secondary | ICD-10-CM | POA: Diagnosis not present

## 2019-03-29 ENCOUNTER — Ambulatory Visit: Payer: BLUE CROSS/BLUE SHIELD

## 2020-03-04 ENCOUNTER — Other Ambulatory Visit: Payer: Self-pay | Admitting: Internal Medicine

## 2020-03-04 DIAGNOSIS — Z1231 Encounter for screening mammogram for malignant neoplasm of breast: Secondary | ICD-10-CM

## 2020-03-04 DIAGNOSIS — E7800 Pure hypercholesterolemia, unspecified: Secondary | ICD-10-CM

## 2020-03-26 ENCOUNTER — Other Ambulatory Visit: Payer: BLUE CROSS/BLUE SHIELD

## 2020-03-26 ENCOUNTER — Ambulatory Visit: Payer: BLUE CROSS/BLUE SHIELD

## 2020-04-11 ENCOUNTER — Ambulatory Visit
Admission: RE | Admit: 2020-04-11 | Discharge: 2020-04-11 | Disposition: A | Discharge status: Home / Self Care | Payer: BLUE CROSS/BLUE SHIELD | Source: Ambulatory Visit | Attending: Internal Medicine | Admitting: Internal Medicine

## 2020-04-11 ENCOUNTER — Other Ambulatory Visit: Payer: Self-pay

## 2020-04-11 ENCOUNTER — Ambulatory Visit: Payer: BLUE CROSS/BLUE SHIELD

## 2020-04-11 ENCOUNTER — Other Ambulatory Visit: Payer: BLUE CROSS/BLUE SHIELD

## 2020-04-11 DIAGNOSIS — Z1231 Encounter for screening mammogram for malignant neoplasm of breast: Secondary | ICD-10-CM

## 2020-04-25 ENCOUNTER — Ambulatory Visit
Admission: RE | Admit: 2020-04-25 | Discharge: 2020-04-25 | Disposition: A | Discharge status: Home / Self Care | Payer: No Typology Code available for payment source | Source: Ambulatory Visit | Attending: Internal Medicine | Admitting: Internal Medicine

## 2020-04-25 DIAGNOSIS — E7800 Pure hypercholesterolemia, unspecified: Secondary | ICD-10-CM

## 2021-02-17 ENCOUNTER — Other Ambulatory Visit: Payer: Self-pay | Admitting: Orthopedic Surgery

## 2021-03-07 ENCOUNTER — Other Ambulatory Visit: Payer: Self-pay | Admitting: Internal Medicine

## 2021-03-07 DIAGNOSIS — Z1231 Encounter for screening mammogram for malignant neoplasm of breast: Secondary | ICD-10-CM

## 2021-04-22 ENCOUNTER — Inpatient Hospital Stay: Admission: RE | Admit: 2021-04-22 | Payer: No Typology Code available for payment source | Source: Ambulatory Visit

## 2021-05-26 ENCOUNTER — Other Ambulatory Visit: Payer: Self-pay | Admitting: Internal Medicine

## 2021-05-26 DIAGNOSIS — Z1231 Encounter for screening mammogram for malignant neoplasm of breast: Secondary | ICD-10-CM

## 2021-05-29 ENCOUNTER — Ambulatory Visit: Payer: No Typology Code available for payment source

## 2021-05-31 ENCOUNTER — Other Ambulatory Visit: Payer: Self-pay

## 2021-05-31 ENCOUNTER — Ambulatory Visit
Admission: RE | Admit: 2021-05-31 | Discharge: 2021-05-31 | Disposition: A | Discharge status: Home / Self Care | Payer: No Typology Code available for payment source | Source: Ambulatory Visit | Attending: Internal Medicine | Admitting: Internal Medicine

## 2021-05-31 DIAGNOSIS — Z1231 Encounter for screening mammogram for malignant neoplasm of breast: Secondary | ICD-10-CM

## 2022-04-13 ENCOUNTER — Other Ambulatory Visit: Payer: Self-pay

## 2022-04-13 ENCOUNTER — Emergency Department (HOSPITAL_COMMUNITY)
Admission: EM | Admit: 2022-04-13 | Discharge: 2022-04-13 | Disposition: A | Discharge status: Home / Self Care | Payer: BLUE CROSS/BLUE SHIELD | Attending: Emergency Medicine | Admitting: Emergency Medicine

## 2022-04-13 ENCOUNTER — Encounter (HOSPITAL_COMMUNITY): Payer: Self-pay

## 2022-04-13 ENCOUNTER — Emergency Department (HOSPITAL_COMMUNITY): Payer: BLUE CROSS/BLUE SHIELD

## 2022-04-13 DIAGNOSIS — I10 Essential (primary) hypertension: Secondary | ICD-10-CM | POA: Diagnosis not present

## 2022-04-13 DIAGNOSIS — R0789 Other chest pain: Secondary | ICD-10-CM | POA: Insufficient documentation

## 2022-04-13 DIAGNOSIS — Z79899 Other long term (current) drug therapy: Secondary | ICD-10-CM | POA: Insufficient documentation

## 2022-04-13 LAB — CBC
HCT: 45.2 % (ref 36.0–46.0)
Hemoglobin: 14.8 g/dL (ref 12.0–15.0)
MCH: 28.4 pg (ref 26.0–34.0)
MCHC: 32.7 g/dL (ref 30.0–36.0)
MCV: 86.6 fL (ref 80.0–100.0)
Platelets: 299 10*3/uL (ref 150–400)
RBC: 5.22 MIL/uL — ABNORMAL HIGH (ref 3.87–5.11)
RDW: 13.5 % (ref 11.5–15.5)
WBC: 7.7 10*3/uL (ref 4.0–10.5)
nRBC: 0 % (ref 0.0–0.2)

## 2022-04-13 LAB — I-STAT BETA HCG BLOOD, ED (MC, WL, AP ONLY): I-stat hCG, quantitative: <5 m[IU]/mL (ref <5)

## 2022-04-13 LAB — TROPONIN I (HIGH SENSITIVITY)
Troponin I (High Sensitivity): 5 ng/L (ref <18)
Troponin I (High Sensitivity): 4 ng/L (ref <18)

## 2022-04-13 LAB — BASIC METABOLIC PANEL
Anion gap: 10 (ref 5–15)
BUN: 11 mg/dL (ref 6–20)
CO2: 23 mmol/L (ref 22–32)
Calcium: 9.5 mg/dL (ref 8.9–10.3)
Chloride: 107 mmol/L (ref 98–111)
Creatinine, Ser: 0.83 mg/dL (ref 0.44–1.00)
GFR, Estimated: >60 mL/min (ref >60)
Glucose, Bld: 100 mg/dL — ABNORMAL HIGH (ref 70–99)
Potassium: 3.7 mmol/L (ref 3.5–5.1)
Sodium: 140 mmol/L (ref 135–145)

## 2022-04-13 MED ORDER — LISINOPRIL 10 MG PO TABS
20.0000 mg | ORAL_TABLET | Freq: Once | ORAL | Status: AC
Start: 2022-04-13 — End: 2022-04-13
  Administered 2022-04-13: 20 mg via ORAL
  Filled 2022-04-13: qty 2

## 2022-04-13 MED ORDER — LISINOPRIL 20 MG PO TABS: 20.0000 mg | ORAL_TABLET | Freq: Every day | ORAL | 1 refills | Status: DC

## 2022-04-13 NOTE — ED Provider Notes (Signed)
?[facility] DEPT ?Provider Note ? ? ?CSN: [id number] ?Arrival date & time: 04/13/22  1021 ? ?  ? ?History ? ?Chief Complaint  ?Patient presents with  ? Chest Pain  ? Hypertension  ? ? ?Heidi Sanders is a 61 y.o. female. ? ?Patient states she has been having some chest discomfort.  She has a history of hyperlipidemia. ? ?The history is provided by the patient and medical records. No language interpreter was used.  ?Chest Pain ?Pain location:  L chest ?Pain quality: aching   ?Pain radiates to:  Does not radiate ?Pain severity:  Mild ?Onset quality:  Sudden ?Timing:  Intermittent ?Progression:  Waxing and waning ?Chronicity:  New ?Context: not breathing   ?Relieved by:  Nothing ?Worsened by:  Nothing ?Associated symptoms: no abdominal pain, no back pain, no cough, no fatigue and no headache   ?Hypertension ?Associated symptoms include chest pain. Pertinent negatives include no abdominal pain and no headaches.  ? ?  ? ?Home Medications ?Prior to Admission medications   ?Medication Sig Start Date End Date Taking? Authorizing Provider  ?lisinopril (ZESTRIL) 20 MG tablet Take 1 tablet (20 mg total) by mouth daily. 04/13/22  Yes Milton Ferguson, MD  ?Diphenhydramine-APAP, sleep, (EXCEDRIN PM PO) Take 1 tablet by mouth at bedtime as needed. sleep    [provider]  ?ibuprofen (ADVIL,MOTRIN) 800 MG tablet Take 1 tablet (800 mg total) by mouth 3 (three) times daily. 08/24/16   Joy, Shawn C, PA-C  ?metoCLOPramide (REGLAN) 10 MG tablet Take 1 tablet (10 mg total) by mouth every 6 (six) hours as needed for nausea (nausea/headache). 08/24/18   Palumbo, April, MD  ?polyethylene glycol (MIRALAX / GLYCOLAX) packet Take 17 g by mouth daily.    [provider]  ?   ? ?Allergies    ?Patient has no known allergies.   ? ?Review of Systems   ?Review of Systems  ?Constitutional:  Negative for appetite change and fatigue.  ?HENT:  Negative for congestion, ear discharge and sinus pressure.   ?Eyes:   Negative for discharge.  ?Respiratory:  Negative for cough.   ?Cardiovascular:  Positive for chest pain.  ?Gastrointestinal:  Negative for abdominal pain and diarrhea.  ?Genitourinary:  Negative for frequency and hematuria.  ?Musculoskeletal:  Negative for back pain.  ?Skin:  Negative for rash.  ?Neurological:  Negative for seizures and headaches.  ?Psychiatric/Behavioral:  Negative for hallucinations.   ? ?Physical Exam ?Updated Vital Signs ?BP (!) 171/102   Pulse 60   Temp 98.7 ?F (37.1 ?C) (Oral)   Resp 17   Ht '5\' 6"'$  (1.676 m)   Wt 78.2 kg   SpO2 100%   BMI 27.81 kg/m?  ?Physical Exam ?Vitals and nursing note reviewed.  ?Constitutional:   ?   Appearance: She is well-developed.  ?HENT:  ?   Head: Normocephalic.  ?   Nose: Nose normal.  ?Eyes:  ?   General: No scleral icterus. ?   Conjunctiva/sclera: Conjunctivae normal.  ?Neck:  ?   Thyroid: No thyromegaly.  ?Cardiovascular:  ?   Rate and Rhythm: Normal rate and regular rhythm.  ?   Heart sounds: No murmur heard. ?  No friction rub. No gallop.  ?Pulmonary:  ?   Breath sounds: No stridor. No wheezing or rales.  ?Chest:  ?   Chest wall: No tenderness.  ?Abdominal:  ?   General: There is no distension.  ?   Tenderness: There is no abdominal tenderness. There is no rebound.  ?  Musculoskeletal:     ?   General: Normal range of motion.  ?   Cervical back: Neck supple.  ?Lymphadenopathy:  ?   Cervical: No cervical adenopathy.  ?Skin: ?   Findings: No erythema or rash.  ?Neurological:  ?   Mental Status: She is oriented to person, place, and time.  ?   Motor: No abnormal muscle tone.  ?   Coordination: Coordination normal.  ?Psychiatric:     ?   Behavior: Behavior normal.  ? ? ?ED Results / Procedures / Treatments   ?Labs ?(all labs ordered are listed, but only abnormal results are displayed) ?Labs Reviewed  ?BASIC METABOLIC PANEL - Abnormal; Notable for the following components:  ?    Result Value  ? Glucose, Bld 100 (*)   ? All other components within normal  limits  ?CBC - Abnormal; Notable for the following components:  ? RBC 5.22 (*)   ? All other components within normal limits  ?I-STAT BETA HCG BLOOD, ED ([facility], [facility], [facility] ONLY)  ?TROPONIN I (HIGH SENSITIVITY)  ?TROPONIN I (HIGH SENSITIVITY)  ? ? ?EKG ?None ? ?Radiology ?DG Chest 2 View ? ?Result Date: 04/13/2022 ?CLINICAL DATA:  Chest pain beginning at 8 o'clock a.m. today. EXAM: CHEST - 2 VIEW COMPARISON:  CT cardiac scoring. FINDINGS: Heart size is normal. Lungs are clear. No edema or effusion is present. Visualized soft tissues and bony thorax are unremarkable. IMPRESSION: Negative two view chest x-ray. Electronically Signed   By: San Morelle M.D.   On: 04/13/2022 11:01   ? ?Procedures ?Procedures  ? ? ?Medications Ordered in ED ?Medications  ?lisinopril (ZESTRIL) tablet 20 mg (20 mg Oral Given 04/13/22 1517)  ? ? ?ED Course/ Medical Decision Making/ A&P ?  ?                        ?Medical Decision Making ?Amount and/or Complexity of Data Reviewed ?Labs: ordered. ?Radiology: ordered. ? ?Risk ?Prescription drug management. ? ?This patient presents to the ED for concern of chest pain, this involves an extensive number of treatment options, and is a complaint that carries with it a high risk of complications and morbidity.  The differential diagnosis includes MI, musculoskeletal pain ? ? ?Co morbidities that complicate the patient evaluation ? ?Elevated lipids ? ? ?Additional history obtained: ? ?Additional history obtained from pt ?External records from outside source obtained and reviewed including hospital record ? ? ?Lab Tests: ? ?I Ordered, and personally interpreted labs.  The pertinent results include: CBC chemistries and troponin which were negative ? ? ?Imaging Studies ordered: ? ?I ordered imaging studies including chest x-ray ?I independently visualized and interpreted imaging which showed negative ?I agree with the radiologist interpretation ? ? ?Cardiac Monitoring: / EKG: ? ?The patient was maintained  on a cardiac monitor.  I personally viewed and interpreted the cardiac monitored which showed an underlying rhythm of: Normal sinus rhythm ? ? ?Consultations Obtained: ? ?No consultant ? ?Problem List / ED Course / Critical interventions / Medication management ? ?Chest pain and elevated lipids and high blood pressure ?I ordered medication including lisinopril for blood pressure ?Reevaluation of the patient after these medicines showed that the patient improved ?I have reviewed the patients home medicines and have made adjustments as needed ? ? ?Social Determinants of Health: ? ?None ? ? ?Test / Admission - Considered: ? ?None ? ?Patient with hypertension and atypical chest pain.  She started on lisinopril and will follow-up  with her PCP.  She also has been referred to cardiology ? ? ? ? ? ? ?Final Clinical Impression(s) / ED Diagnoses ?Final diagnoses:  ?Atypical chest pain  ?Primary hypertension  ? ? ?Rx / DC Orders ?ED Discharge Orders   ? ?      Ordered  ?  lisinopril (ZESTRIL) 20 MG tablet  Daily       ? 04/13/22 1609  ? ?  ?  ? ?  ? ? ?  ?Milton Ferguson, MD ?04/14/22 1815 ? ?

## 2022-04-13 NOTE — Discharge Instructions (Signed)
Follow-up with your doctor within a week to have your blood pressure checked.  Also I referred you to a cardiologist if your doctor does not get you a stress test ?

## 2022-04-13 NOTE — ED Triage Notes (Signed)
Patient c/o mid chest pain that started around 0800 today. Patient  had SOB when the chest pain occurred. ?Patient denies any N/V or diaphoresis. ?

## 2022-05-06 ENCOUNTER — Other Ambulatory Visit: Payer: Self-pay | Admitting: Internal Medicine

## 2022-05-06 DIAGNOSIS — Z1231 Encounter for screening mammogram for malignant neoplasm of breast: Secondary | ICD-10-CM

## 2022-06-01 ENCOUNTER — Ambulatory Visit: Payer: No Typology Code available for payment source

## 2022-06-10 ENCOUNTER — Other Ambulatory Visit: Payer: Self-pay

## 2022-06-10 ENCOUNTER — Encounter (HOSPITAL_COMMUNITY): Payer: Self-pay

## 2022-06-10 ENCOUNTER — Emergency Department (HOSPITAL_COMMUNITY)
Admission: EM | Admit: 2022-06-10 | Discharge: 2022-06-10 | Disposition: A | Discharge status: Home / Self Care | Payer: BLUE CROSS/BLUE SHIELD | Attending: Emergency Medicine | Admitting: Emergency Medicine

## 2022-06-10 DIAGNOSIS — I10 Essential (primary) hypertension: Secondary | ICD-10-CM | POA: Insufficient documentation

## 2022-06-10 DIAGNOSIS — Z79899 Other long term (current) drug therapy: Secondary | ICD-10-CM | POA: Diagnosis not present

## 2022-06-10 MED ORDER — AMLODIPINE BESYLATE 5 MG PO TABS: 5.0000 mg | ORAL_TABLET | Freq: Every day | ORAL | 0 refills | Status: DC

## 2022-06-10 MED ORDER — AMLODIPINE BESYLATE 5 MG PO TABS: 5.0000 mg | ORAL_TABLET | Freq: Once | ORAL | Status: DC

## 2022-06-10 MED ORDER — AMLODIPINE BESYLATE 5 MG PO TABS
10.0000 mg | ORAL_TABLET | Freq: Once | ORAL | Status: AC
  Administered 2022-06-10: 10 mg via ORAL
  Filled 2022-06-10: qty 2

## 2022-06-10 MED ORDER — ACETAMINOPHEN 500 MG PO TABS
1000.0000 mg | ORAL_TABLET | ORAL | Status: AC
  Administered 2022-06-10: 1000 mg via ORAL
  Filled 2022-06-10: qty 2

## 2022-06-10 MED ORDER — AMLODIPINE BESYLATE 10 MG PO TABS: 10.0000 mg | ORAL_TABLET | Freq: Every day | ORAL | 0 refills | Status: AC

## 2022-06-10 MED ORDER — LISINOPRIL 20 MG PO TABS: 20.0000 mg | ORAL_TABLET | Freq: Once | ORAL | Status: DC

## 2022-06-10 NOTE — ED Provider Notes (Cosign Needed)
[facility] EMERGENCY DEPARTMENT Provider Note   CSN: [id number] Arrival date & time: 06/10/22  1025     History  Chief Complaint  Patient presents with   Hypertension    Heidi Sanders is a 61 y.o. female.  61 y.o female with no PMH presents to the ED sent in by [facility] for elevated blood pressure control. Patient reports she was at her OB/GYN's office when they checked her blood pressure and her systolic pressure was around the 180s, they reports she had her procedure done such as a Pap smear and afterwards blood pressure was rechecked again and this was more elevated.  She reports having the similar blood pressure 2 weeks ago while in the ED, was placed on BP medication but never filled this medication.  She reports she was given a referral for cardiology and has had a recent stress test, echo, EKG all within normal limits.  She is here today with no chest pain, no headache, no shortness of breath, no neuro findings. Does not have a prior history of CAD, is a non-smoker, no family history of CAD.  The history is provided by the patient.  Hypertension This is a new problem. Pertinent negatives include no chest pain, no abdominal pain, no headaches and no shortness of breath.       Home Medications Prior to Admission medications   Medication Sig Start Date End Date Taking? Authorizing Provider  amLODipine (NORVASC) 10 MG tablet Take 1 tablet (10 mg total) by mouth daily. 06/10/22 07/10/22 Yes Aslan Montagna, PA-C  Diphenhydramine-APAP, sleep, (EXCEDRIN PM PO) Take 1 tablet by mouth at bedtime as needed. sleep    [provider]  ibuprofen (ADVIL,MOTRIN) 800 MG tablet Take 1 tablet (800 mg total) by mouth 3 (three) times daily. 08/24/16   Joy, Shawn C, PA-C  lisinopril (ZESTRIL) 20 MG tablet Take 1 tablet (20 mg total) by mouth daily. 04/13/22   Milton Ferguson, MD  metoCLOPramide (REGLAN) 10 MG tablet Take 1 tablet (10 mg total) by mouth every 6 (six) hours as  needed for nausea (nausea/headache). 08/24/18   Palumbo, April, MD  polyethylene glycol (MIRALAX / GLYCOLAX) packet Take 17 g by mouth daily.    [provider]      Allergies    Patient has no known allergies.    Review of Systems   Review of Systems  Constitutional:  Negative for fever.  HENT:  Negative for sore throat.   Respiratory:  Negative for shortness of breath.   Cardiovascular:  Negative for chest pain.  Gastrointestinal:  Negative for abdominal pain, nausea and vomiting.  Genitourinary:  Negative for flank pain.  Neurological:  Negative for dizziness, facial asymmetry, weakness, light-headedness, numbness and headaches.  All other systems reviewed and are negative.   Physical Exam Updated Vital Signs BP (!) 183/92   Pulse 66   Temp 97.8 F (36.6 C) (Oral)   Resp 18   Ht '5\' 5"'$  (1.651 m)   Wt 79.4 kg   SpO2 100%   BMI 29.12 kg/m  Physical Exam  ED Results / Procedures / Treatments   Labs (all labs ordered are listed, but only abnormal results are displayed) Labs Reviewed - No data to display  EKG None  Radiology No results found.  Procedures Procedures    Medications Ordered in ED Medications  acetaminophen (TYLENOL) tablet 1,000 mg (1,000 mg Oral Given 06/10/22 1235)  amLODipine (NORVASC) tablet 10 mg (10 mg Oral Given 06/10/22 1235)  ED Course/ Medical Decision Making/ A&P                           Medical Decision Making    Patient presents to the ED with a chief complaint of elevated blood pressure.  Was at OB/GYN office today when she was noted to have a systolic in the 938H.  Patient did have some nagging headache while she arrived in the ED however after Tylenol her symptoms resolved.  She reports a similar episode approximately 2 months ago and was seen in the emergency department, she was told to follow-up with cardiology which she has done so.  She reports having an EKG, echo, stress test and they were all within normal limits.   She reports she has not had a history of high blood pressure and is currently not taking any medication.  She did note her blood pressure to be elevated at all of these appointments, 2 months ago she was given a prescription for lisinopril but she reports she never started taking this medication as "I ran into one of the nurses at home and not to do so ", on today's visit she is asymptomatic, she does not have any prior history of CKD, no chest pain, no shortness of breath, no headache.  I do feel that patient needs outpatient follow-up with PCP in order to started on antihypertensive.  I will discharge her home with amlodipine 10 mg daily for the next 30 days.  She is agreeable of this plan and treatment, she is without any complaints.  Patient stable for discharge.    Patient asymptomatic with no noted s/s of end organ damage.  No chest pain, diaphoresis, nausea or other acs symptoms.  No headache or neurologic complaints,  no unequal pulses, normal pulse ox without rales or sob.  Feel this is unlikely to be a Hypertensive Emergency and recent studies suggest no benefit for inpatient admission.  There are also no studies to my knowledge suggesting that patients with hypertensive urgency have increased risk for end organ disease. In fact there has been a study recently that would suggest that the rapid change can induce harm.  The [employer] policy statement on asymptomatic hypertension does not  recommend routing ED medical intervention. The patient will follow up closely with their PCP.  Compliance with their medication stressed.    Lowell Guitar, Cicero Duck EH, et al. Characteristics and outcomes of patients presenting with hypertensive urgency in the office setting. JAMA Intern Med. 2016 Jul 1; 176(7): 981-8.   Cerebrovascular risks with rapid blood pressure lowering in the absence of hypertensive emergency Narda Bonds, Wende Crease, et al. Am J Emerg Med.  2019;37(6):1073-1077.    Portions of this note were generated with [employer]. Dictation errors may occur despite best attempts at proofreading.   Final Clinical Impression(s) / ED Diagnoses Final diagnoses:  Hypertension, unspecified type    Rx / DC Orders ED Discharge Orders          Ordered    amLODipine (NORVASC) 5 MG tablet  Daily,   Status:  Discontinued        06/10/22 1549    amLODipine (NORVASC) 10 MG tablet  Daily        06/10/22 1550              Janeece Fitting, PA-C 06/10/22 1615

## 2022-06-10 NOTE — Discharge Instructions (Addendum)
We placed you on medication to help control your blood pressure.   Please take one tablet daily for the next 7 days. You will need to follow up with your primary care physician in about two weeks.  If you experience any chest pain, shortness of breath, or headaches you will need to return to the ED.

## 2022-06-10 NOTE — ED Triage Notes (Signed)
Pt arrived POV from the OBGYN c/o hypertension. Pt states this happened a month ago and they gave her lisinopril but she did not take it thinking she could fix it herself.

## 2022-08-20 ENCOUNTER — Encounter: Payer: Self-pay | Admitting: Internal Medicine

## 2022-09-03 ENCOUNTER — Encounter: Payer: Self-pay | Admitting: Internal Medicine

## 2022-10-08 ENCOUNTER — Encounter: Payer: No Typology Code available for payment source | Admitting: Internal Medicine

## 2022-11-16 ENCOUNTER — Ambulatory Visit (AMBULATORY_SURGERY_CENTER): Payer: Self-pay

## 2022-11-16 VITALS — Ht 66.0 in | Wt 174.0 lb

## 2022-11-16 DIAGNOSIS — Z86010 Personal history of colonic polyps: Secondary | ICD-10-CM

## 2022-11-16 MED ORDER — NA SULFATE-K SULFATE-MG SULF 17.5-3.13-1.6 GM/177ML PO SOLN: 1.0000 | Freq: Once | ORAL | 0 refills | Status: AC

## 2022-11-16 NOTE — Progress Notes (Signed)
No egg or soy allergy known to patient  No issues known to pt with past sedation with any surgeries or procedures Patient denies ever being told they had issues or difficulty with intubation  No FH of Malignant Hyperthermia Pt is not on diet pills Pt is not on  home 02  Pt is not on blood thinners  Pt denies issues with constipation  No A fib or A flutter Have any cardiac testing pending--no Pt instructed to use Singlecare.com or GoodRx for a price reduction on prep   

## 2022-12-08 ENCOUNTER — Telehealth: Payer: Self-pay | Admitting: Internal Medicine

## 2022-12-08 DIAGNOSIS — Z86010 Personal history of colonic polyps: Secondary | ICD-10-CM

## 2022-12-08 MED ORDER — NA SULFATE-K SULFATE-MG SULF 17.5-3.13-1.6 GM/177ML PO SOLN: 1.0000 | Freq: Once | ORAL | 0 refills | Status: AC

## 2022-12-08 MED ORDER — NA SULFATE-K SULFATE-MG SULF 17.5-3.13-1.6 GM/177ML PO SOLN: 1.0000 | Freq: Once | ORAL | 0 refills | Status: DC

## 2022-12-08 NOTE — Telephone Encounter (Signed)
Inbound call from patient stating that she is scheduled to have a colonoscopy on Thursday and was not able to pick up her prep when [place] was [place]. Patient is requesting the prep medication be resent to the pharmacy . Please advise.

## 2022-12-08 NOTE — Telephone Encounter (Signed)
Returned patient call and sent prep to [facility] per patient request.

## 2022-12-09 ENCOUNTER — Encounter: Payer: Self-pay | Admitting: Internal Medicine

## 2022-12-10 ENCOUNTER — Encounter: Payer: Self-pay | Admitting: Internal Medicine

## 2022-12-10 ENCOUNTER — Ambulatory Visit (AMBULATORY_SURGERY_CENTER): Payer: 59 | Admitting: Internal Medicine

## 2022-12-10 VITALS — BP 136/79 | HR 58 | Temp 97.8°F | Resp 12 | Ht 65.0 in | Wt 174.0 lb

## 2022-12-10 DIAGNOSIS — Z86010 Personal history of colonic polyps: Secondary | ICD-10-CM | POA: Diagnosis not present

## 2022-12-10 DIAGNOSIS — Z09 Encounter for follow-up examination after completed treatment for conditions other than malignant neoplasm: Secondary | ICD-10-CM

## 2022-12-10 DIAGNOSIS — E785 Hyperlipidemia, unspecified: Secondary | ICD-10-CM | POA: Diagnosis not present

## 2022-12-10 DIAGNOSIS — Z1211 Encounter for screening for malignant neoplasm of colon: Secondary | ICD-10-CM | POA: Diagnosis not present

## 2022-12-10 DIAGNOSIS — D123 Benign neoplasm of transverse colon: Secondary | ICD-10-CM | POA: Diagnosis not present

## 2022-12-10 DIAGNOSIS — F32A Depression, unspecified: Secondary | ICD-10-CM | POA: Diagnosis not present

## 2022-12-10 MED ORDER — SODIUM CHLORIDE 0.9 % IV SOLN: 500.0000 mL | INTRAVENOUS | Status: AC

## 2022-12-10 NOTE — Progress Notes (Signed)
Sedate, gd SR, tolerated procedure well, VSS, report to RN 

## 2022-12-10 NOTE — Progress Notes (Signed)
GASTROENTEROLOGY PROCEDURE H&P NOTE   Primary Care Physician: Haywood Pao, MD    Reason for Procedure:   Hx of colon polyps  Plan:    colonoscopy  Patient is appropriate for endoscopic procedure(s) in the ambulatory ([place]) setting.  The nature of the procedure, as well as the risks, benefits, and alternatives were carefully and thoroughly reviewed with the patient. Ample time for discussion and questions allowed. The patient understood, was satisfied, and agreed to proceed.     HPI: Shakiera Edelson is a 61 y.o. female who presents for surveillance colonoscopy.  Medical history as below.  Tolerated the prep.  No recent chest pain or shortness of breath.  No abdominal pain today.  Past Medical History:  Diagnosis Date   Abnormal colonoscopy    polyps removed and abnormal lesions seen but could not be removed   Complication of anesthesia    PT TOLD THAT HER B/P DROPPED DURING HER COLONOSCOPY   Condyloma    Depression    Hyperlipidemia    Postmenopausal    PT STATES HER LAST PERIOD WAS OVER 1 YEAR AGO--SHE IS ON CLIMARA PATCH    Past Surgical History:  Procedure Laterality Date   BUNIONECTOMY  12/2010 &2002   left 2012             Right 2002   EXAMINATION UNDER ANESTHESIA  07/25/2012   Procedure: EXAM UNDER ANESTHESIA;  Surgeon: Marcello Moores A. Cornett, MD;  Location: [facility];  Service: General;  Laterality: N/A;  anal intraepithelial neoplasia   HYSTEROSCOPY W/ ENDOMETRIAL ABLATION  2008    Prior to Admission medications   Medication Sig Start Date End Date Taking? Authorizing Provider  amLODipine (NORVASC) 5 MG tablet Take 5 mg by mouth daily.   Yes [provider]  amLODipine (NORVASC) 10 MG tablet Take 1 tablet (10 mg total) by mouth daily. 06/10/22 07/10/22  Janeece Fitting, PA-C  Diphenhydramine-APAP, sleep, (EXCEDRIN PM PO) Take 1 tablet by mouth at bedtime as needed. sleep    [provider]  ibuprofen (ADVIL,MOTRIN) 800 MG tablet Take 1 tablet (800 mg  total) by mouth 3 (three) times daily. Patient not taking: Reported on 11/16/2022 08/24/16   Joy, Helane Gunther, PA-C  metoCLOPramide (REGLAN) 10 MG tablet Take 1 tablet (10 mg total) by mouth every 6 (six) hours as needed for nausea (nausea/headache). Patient not taking: Reported on 11/16/2022 08/24/18   Palumbo, April, MD  polyethylene glycol (MIRALAX / GLYCOLAX) packet Take 17 g by mouth daily. Patient not taking: Reported on 11/16/2022    [provider]    Current Outpatient Medications  Medication Sig Dispense Refill   amLODipine (NORVASC) 5 MG tablet Take 5 mg by mouth daily.     amLODipine (NORVASC) 10 MG tablet Take 1 tablet (10 mg total) by mouth daily. 30 tablet 0   Diphenhydramine-APAP, sleep, (EXCEDRIN PM PO) Take 1 tablet by mouth at bedtime as needed. sleep     ibuprofen (ADVIL,MOTRIN) 800 MG tablet Take 1 tablet (800 mg total) by mouth 3 (three) times daily. (Patient not taking: Reported on 11/16/2022) 21 tablet 0   metoCLOPramide (REGLAN) 10 MG tablet Take 1 tablet (10 mg total) by mouth every 6 (six) hours as needed for nausea (nausea/headache). (Patient not taking: Reported on 11/16/2022) 6 tablet 0   polyethylene glycol (MIRALAX / GLYCOLAX) packet Take 17 g by mouth daily. (Patient not taking: Reported on 11/16/2022)     Current Facility-Administered Medications  Medication Dose Route Frequency Provider  Last Rate Last Admin   0.9 %  sodium chloride infusion  500 mL Intravenous Continuous Lorina Duffner, Lajuan Lines, MD       0.9 %  sodium chloride infusion  500 mL Intravenous Continuous Leah Thornberry, Lajuan Lines, MD        Allergies as of 12/10/2022   (No Known Allergies)    Family History  Problem Relation Age of Onset   Colon polyps Sister    Colon polyps Sister    Diabetes Father    Colon cancer Neg Hx    Esophageal cancer Neg Hx    Rectal cancer Neg Hx    Stomach cancer Neg Hx     Social History   Socioeconomic History   Marital status: Married    Spouse name: Not on file    Number of children: Not on file   Years of education: Not on file   Highest education level: Not on file  Occupational History   Not on file  Tobacco Use   Smoking status: Never   Smokeless tobacco: Never  Vaping Use   Vaping Use: Never used  Substance and Sexual Activity   Alcohol use: No   Drug use: No   Sexual activity: Never  Other Topics Concern   Not on file  Social History Narrative   Not on file   Social Determinants of Health   Financial Resource Strain: Not on file  Food Insecurity: Not on file  Transportation Needs: Not on file  Physical Activity: Not on file  Stress: Not on file  Social Connections: Not on file  Intimate Partner Violence: Not on file    Physical Exam: Vital signs in last 24 hours: '@BP'$  (!) 149/84   Pulse 66   Temp 97.8 F (36.6 C)   Ht '5\' 5"'$  (1.651 m)   Wt 174 lb (78.9 kg)   SpO2 99%   BMI 28.96 kg/m  GEN: NAD EYE: Sclerae anicteric ENT: MMM CV: Non-tachycardic Pulm: CTA b/l GI: Soft, NT/ND NEURO:  Alert & Oriented x 3   Zenovia Jarred, MD [facility]  12/10/2022 8:34 AM

## 2022-12-10 NOTE — Progress Notes (Signed)
Pt's states no medical or surgical changes since previsit or office visit. 

## 2022-12-10 NOTE — Patient Instructions (Addendum)
RECOMMENDATIONS: - Patient has a contact number available for emergencies. The signs and symptoms of potential delayed complications were discussed with the patient. Return to normal activities tomorrow. Written discharge instructions were provided to the patient. - Resume previous diet. - Continue present medications. - Await pathology results. - Repeat colonoscopy is recommended for surveillance. The colonoscopy date will be   YOU HAD AN ENDOSCOPIC PROCEDURE TODAY AT [place]:   Refer to the procedure report that was given to you for any specific questions about what was found during the examination.  If the procedure report does not answer your questions, please call your gastroenterologist to clarify.  If you requested that your care partner not be given the details of your procedure findings, then the procedure report has been included in a sealed envelope for you to review at your convenience later.  YOU SHOULD EXPECT: Some feelings of bloating in the abdomen. Passage of more gas than usual.  Walking can help get rid of the air that was put into your GI tract during the procedure and reduce the bloating. If you had a lower endoscopy (such as a colonoscopy or flexible sigmoidoscopy) you may notice spotting of blood in your stool or on the toilet paper. If you underwent a bowel prep for your procedure, you may not have a normal bowel movement for a few days.  Please Note:  You might notice some irritation and congestion in your nose or some drainage.  This is from the oxygen used during your procedure.  There is no need for concern and it should clear up in a day or so.  SYMPTOMS TO REPORT IMMEDIATELY:  Following lower endoscopy (colonoscopy or flexible sigmoidoscopy):  Excessive amounts of blood in the stool  Significant tenderness or worsening of abdominal pains  Swelling of the abdomen that is new, acute  Fever of 100F or higher   For urgent or emergent issues, a  gastroenterologist can be reached at any hour by calling [phone]. Do not use MyChart messaging for urgent concerns.    DIET:  We do recommend a small meal at first, but then you may proceed to your regular diet.  Drink plenty of fluids but you should avoid alcoholic beverages for 24 hours.  MEDICATIONS: Continue present medications.  Please see handouts given to you by your recovery nurse: Polyps.  Thank you for allowing [place] to provide for your healthcare needs today.  ACTIVITY:  You should plan to take it easy for the rest of today and you should NOT DRIVE or use heavy machinery until tomorrow (because of the sedation medicines used during the test).    FOLLOW UP: Our staff will call the number listed on your records the next business day following your procedure.  We will call around 7:15- 8:00 am to check on you and address any questions or concerns that you may have regarding the information given to you following your procedure. If we do not reach you, we will leave a message.     If any biopsies were taken you will be contacted by phone or by letter within the next 1-3 weeks.  Please call us at [phone] if you have not heard about the biopsies in 3 weeks.    SIGNATURES/CONFIDENTIALITY: You and/or your care partner have signed paperwork which will be entered into your electronic medical record.  These signatures attest to the fact that that the information above on your After Visit Summary has been reviewed and  is understood.  Full responsibility of the confidentiality of this discharge information lies with you and/or your care-partner.

## 2022-12-10 NOTE — Op Note (Signed)
[place] Patient Name: Brodie Scovell Procedure Date: 12/10/2022 8:31 AM MRN: [id number] Endoscopist: Jerene Bears , MD, [id number] Age: 60 Referring MD:  Date of Birth: [birth year] Gender: Female Account #: [id number] Procedure:                Colonoscopy Indications:              High risk colon cancer surveillance: Personal                            history of non-advanced adenoma, Last colonoscopy:                            July 2018 Medicines:                Monitored Anesthesia Care Procedure:                Pre-Anesthesia Assessment:                           - Prior to the procedure, a History and Physical                            was performed, and patient medications and                            allergies were reviewed. The patient's tolerance of                            previous anesthesia was also reviewed. The risks                            and benefits of the procedure and the sedation                            options and risks were discussed with the patient.                            All questions were answered, and informed consent                            was obtained. Prior Anticoagulants: The patient has                            taken no anticoagulant or antiplatelet agents. ASA                            Grade Assessment: II - A patient with mild systemic                            disease. After reviewing the risks and benefits,                            the patient was deemed in satisfactory condition to  undergo the procedure.                           After obtaining informed consent, the colonoscope                            was passed under direct vision. Throughout the                            procedure, the patient's blood pressure, pulse, and                            oxygen saturations were monitored continuously. The                            Olympus PCF-[id number] (#[id number]) Colonoscope was                             introduced through the anus and advanced to the                            cecum, identified by appendiceal orifice and                            ileocecal valve. The colonoscopy was somewhat                            difficult due to a redundant colon. The patient                            tolerated the procedure well. The quality of the                            bowel preparation was good (after irrigation and                            lavage). The ileocecal valve, appendiceal orifice,                            and rectum were photographed. Scope In: 8:43:43 AM Scope Out: 9:06:36 AM Scope Withdrawal Time: 0 hours 12 minutes 54 seconds  Total Procedure Duration: 0 hours 22 minutes 53 seconds  Findings:                 The digital rectal exam was normal.                           A 5 mm polyp was found in the transverse colon. The                            polyp was sessile. The polyp was removed with a                            cold snare. Resection and retrieval were complete.  The exam was otherwise without abnormality on                            direct and retroflexion views. Complications:            No immediate complications. Estimated Blood Loss:     Estimated blood loss: none. Impression:               - One 5 mm polyp in the transverse colon, removed                            with a cold snare. Resected and retrieved.                           - The examination was otherwise normal on direct                            and retroflexion views. Recommendation:           - Patient has a contact number available for                            emergencies. The signs and symptoms of potential                            delayed complications were discussed with the                            patient. Return to normal activities tomorrow.                            Written discharge instructions were provided to the                             patient.                           - Resume previous diet.                           - Continue present medications.                           - Await pathology results.                           - Repeat colonoscopy is recommended for                            surveillance. The colonoscopy date will be                            determined after pathology results from today's                            exam become available for review. Jerene Bears, MD 12/10/2022 9:09:15 AM This report has been  signed electronically.

## 2022-12-10 NOTE — Progress Notes (Signed)
Called to room to assist during endoscopic procedure.  Patient ID and intended procedure confirmed with present staff. Received instructions for my participation in the procedure from the performing physician.  

## 2022-12-11 ENCOUNTER — Telehealth: Payer: Self-pay | Admitting: *Deleted

## 2022-12-11 NOTE — Telephone Encounter (Signed)
  Follow up Call-     12/10/2022    8:00 AM  Call back number  Post procedure Call Back phone  # [phone]  Permission to leave phone message Yes     Patient questions:  Do you have a fever, pain , or abdominal swelling? No. Pain Score  0 *  Have you tolerated food without any problems? Yes.    Have you been able to return to your normal activities? Yes.    Do you have any questions about your discharge instructions: Diet   No. Medications  No. Follow up visit  No.  Do you have questions or concerns about your Care? No.  Actions: * If pain score is 4 or above: No action needed, pain <4.

## 2022-12-16 ENCOUNTER — Encounter: Payer: Self-pay | Admitting: Internal Medicine

## 2023-03-29 DIAGNOSIS — R7989 Other specified abnormal findings of blood chemistry: Secondary | ICD-10-CM | POA: Diagnosis not present

## 2023-03-29 DIAGNOSIS — F419 Anxiety disorder, unspecified: Secondary | ICD-10-CM | POA: Diagnosis not present

## 2023-03-29 DIAGNOSIS — E7800 Pure hypercholesterolemia, unspecified: Secondary | ICD-10-CM | POA: Diagnosis not present

## 2023-04-05 DIAGNOSIS — E663 Overweight: Secondary | ICD-10-CM | POA: Diagnosis not present

## 2023-04-05 DIAGNOSIS — J45909 Unspecified asthma, uncomplicated: Secondary | ICD-10-CM | POA: Diagnosis not present

## 2023-04-05 DIAGNOSIS — C210 Malignant neoplasm of anus, unspecified: Secondary | ICD-10-CM | POA: Diagnosis not present

## 2023-04-05 DIAGNOSIS — E7800 Pure hypercholesterolemia, unspecified: Secondary | ICD-10-CM | POA: Diagnosis not present

## 2023-04-05 DIAGNOSIS — Z1339 Encounter for screening examination for other mental health and behavioral disorders: Secondary | ICD-10-CM | POA: Diagnosis not present

## 2023-04-05 DIAGNOSIS — F331 Major depressive disorder, recurrent, moderate: Secondary | ICD-10-CM | POA: Diagnosis not present

## 2023-04-05 DIAGNOSIS — F419 Anxiety disorder, unspecified: Secondary | ICD-10-CM | POA: Diagnosis not present

## 2023-04-05 DIAGNOSIS — Z1331 Encounter for screening for depression: Secondary | ICD-10-CM | POA: Diagnosis not present

## 2023-04-05 DIAGNOSIS — Z0000 Encounter for general adult medical examination without abnormal findings: Secondary | ICD-10-CM | POA: Diagnosis not present

## 2023-04-05 DIAGNOSIS — Z800 Family history of malignant neoplasm of digestive organs: Secondary | ICD-10-CM | POA: Diagnosis not present

## 2023-04-05 DIAGNOSIS — D126 Benign neoplasm of colon, unspecified: Secondary | ICD-10-CM | POA: Diagnosis not present

## 2023-04-05 DIAGNOSIS — I10 Essential (primary) hypertension: Secondary | ICD-10-CM | POA: Diagnosis not present

## 2023-04-19 IMAGING — MG MM DIGITAL SCREENING BILAT W/ TOMO AND CAD
6 of 10 series · 6 of 30 positions shown · non-contrast
Comparison: Previous exam(s).

CLINICAL DATA: Screening.

EXAM:
DIGITAL SCREENING BILATERAL MAMMOGRAM WITH TOMOSYNTHESIS AND CAD
TECHNIQUE: Bilateral screening digital craniocaudal and mediolateral oblique
mammograms were obtained. Bilateral screening digital breast
tomosynthesis was performed. The images were evaluated with
computer-aided detection.

[L MLO synth-2D (1 of 2)]
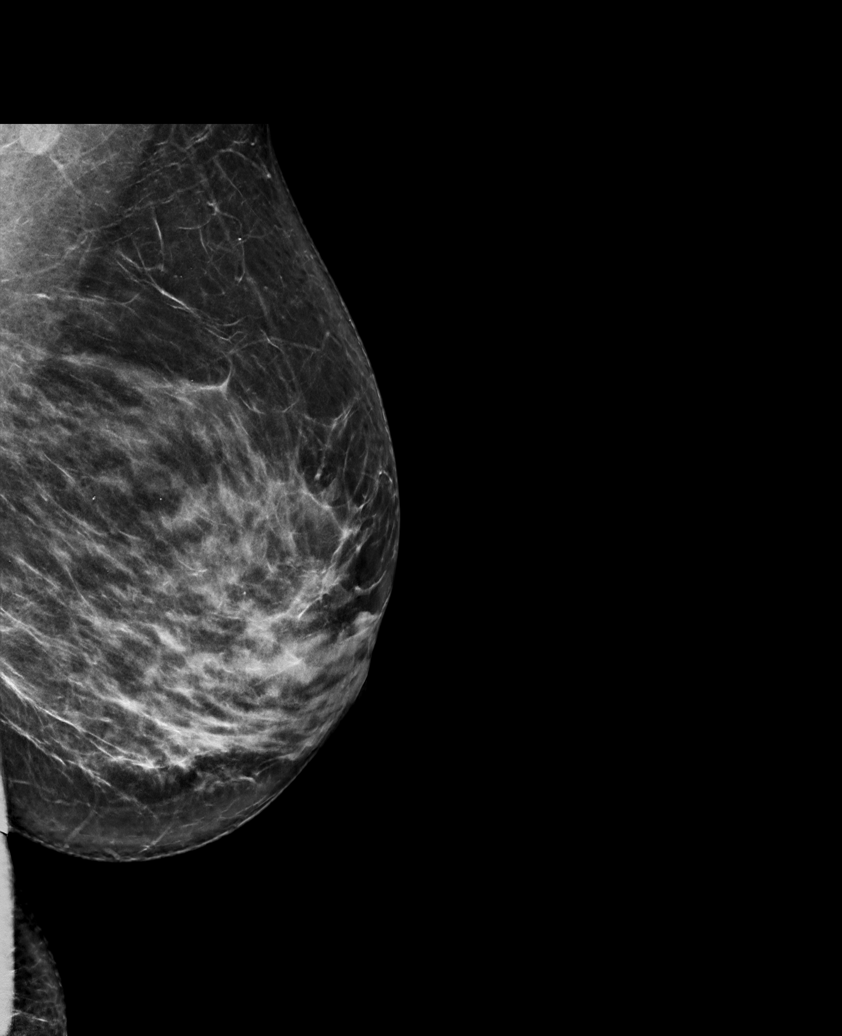

[L MLO synth-2D (2 of 2)]
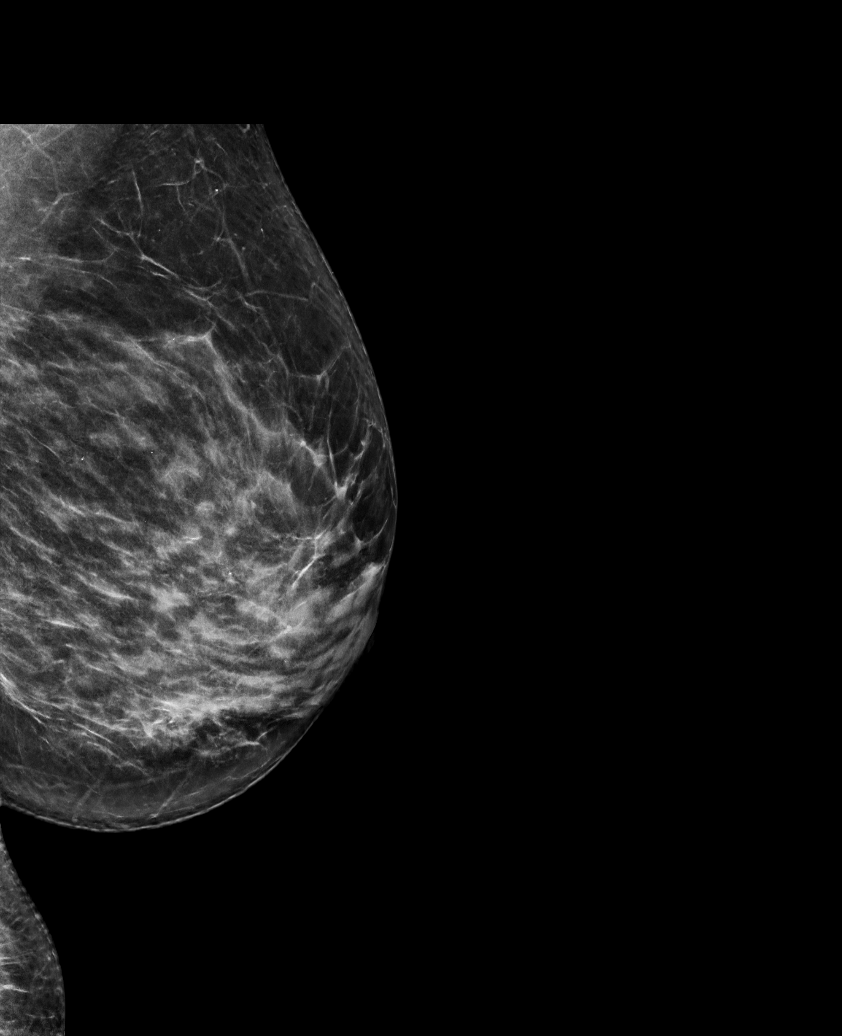

[R MLO synth-2D]
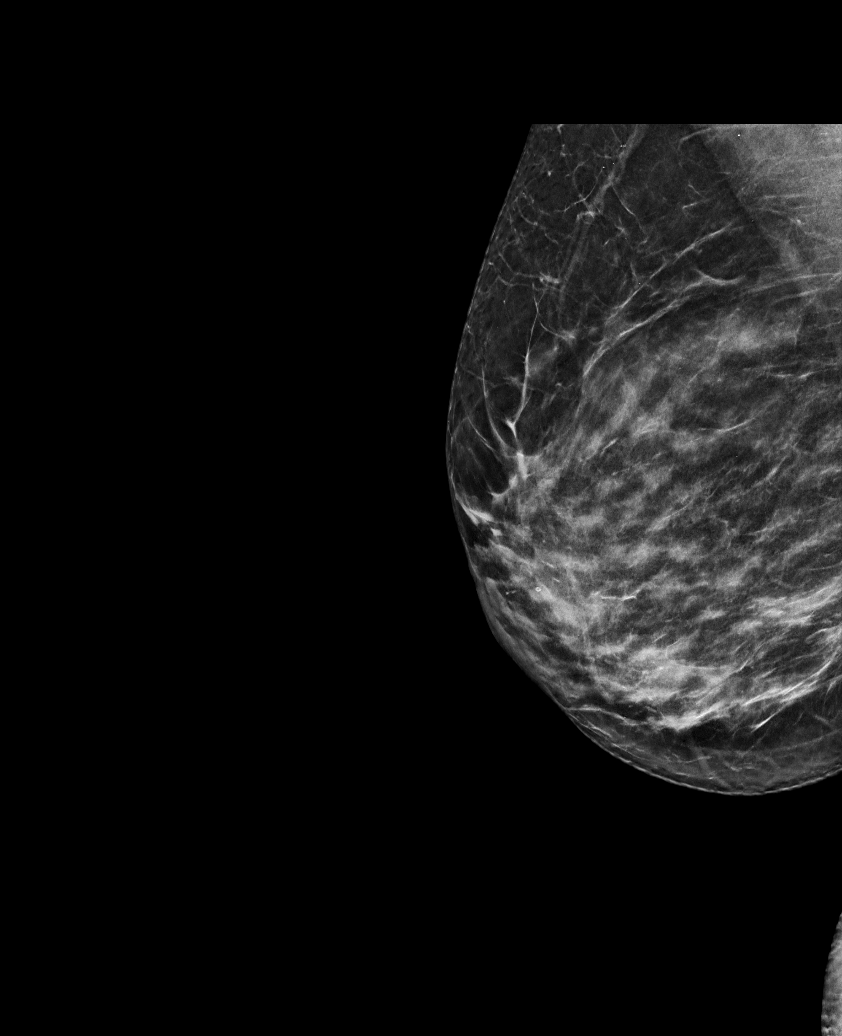

[R CC synth-2D]
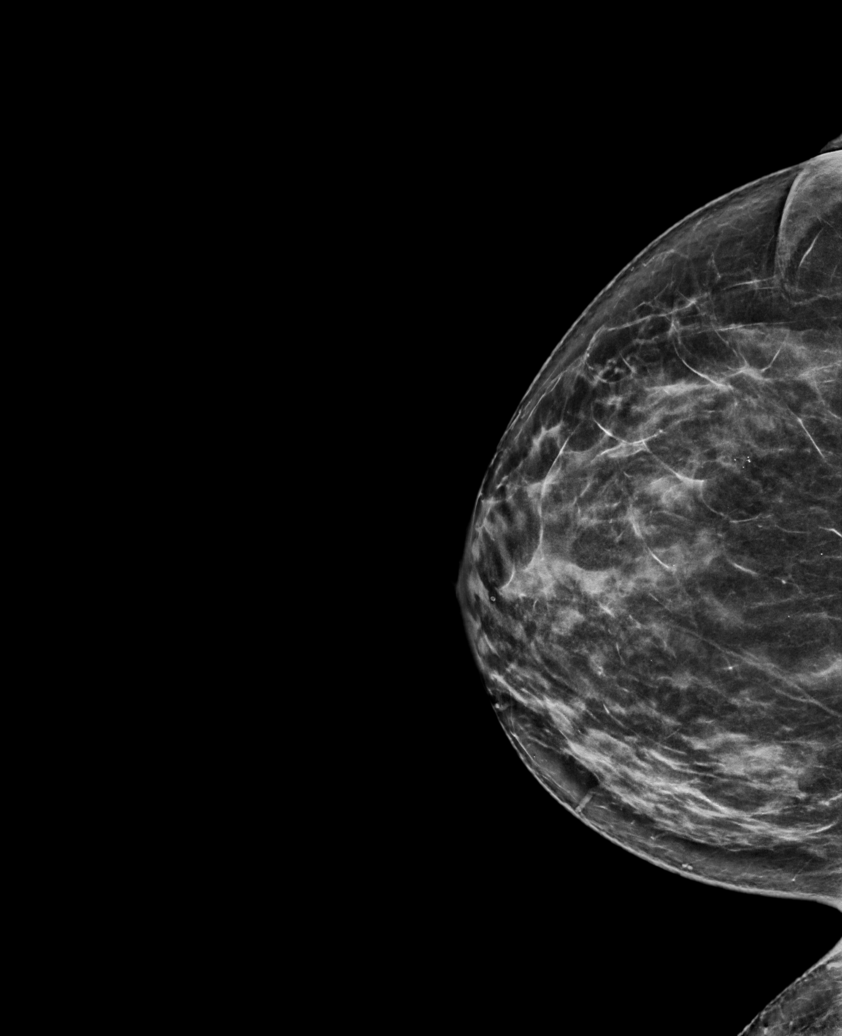

[L CC synth-2D]
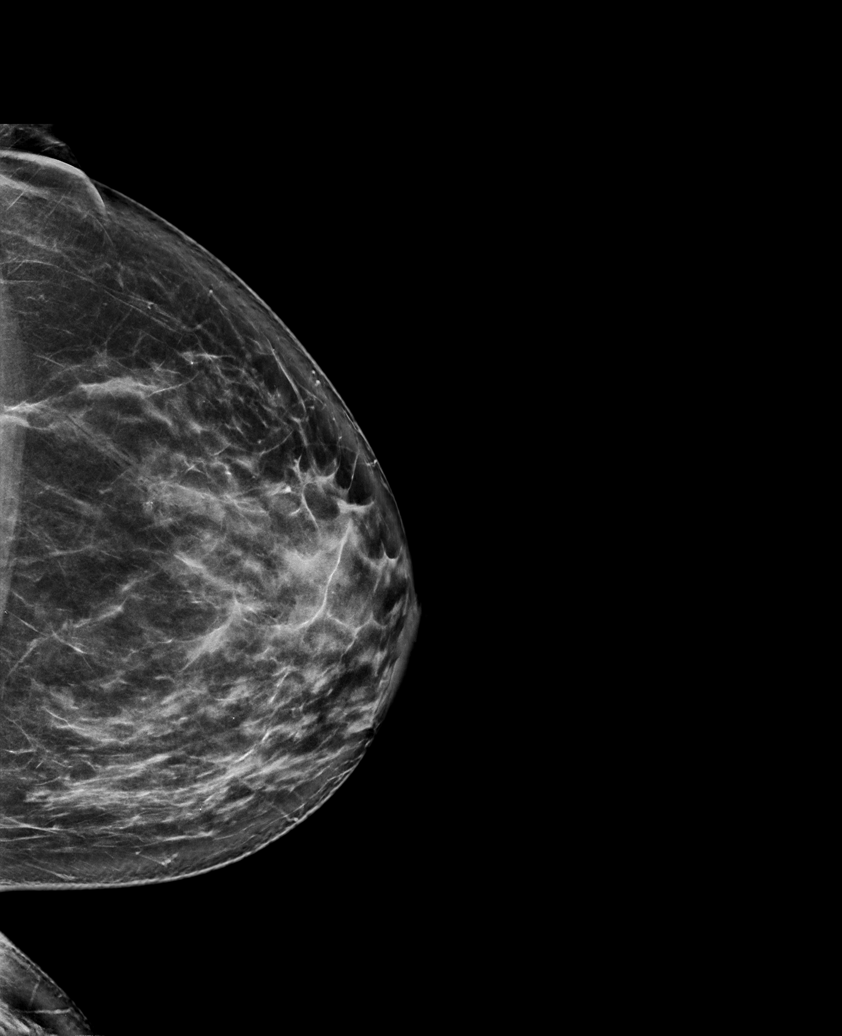

[L MLO tomo · tomo slice 39/77.0]
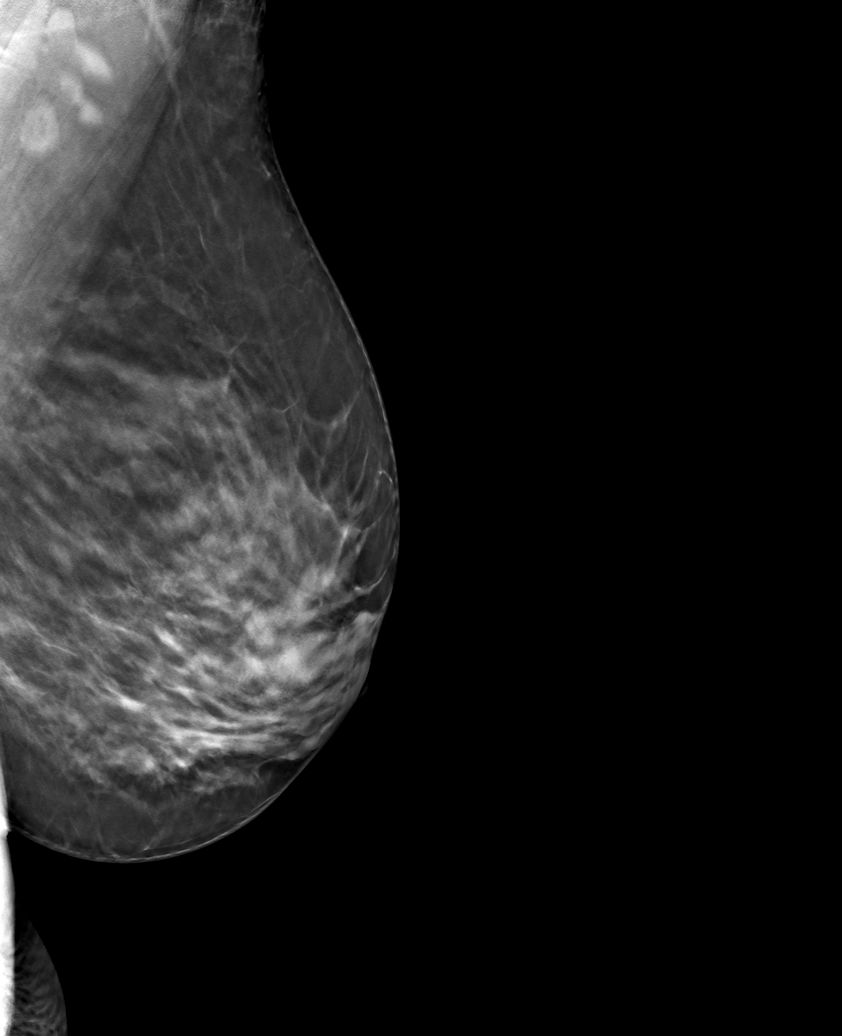

[6 of 30 positions shown; findings below may reference images not displayed]

ACR Breast Density Category c: The breast tissue is heterogeneously
dense, which may obscure small masses.
FINDINGS: There are no findings suspicious for malignancy. The images were
evaluated with computer-aided detection.
IMPRESSION: No mammographic evidence of malignancy. A result letter of this
screening mammogram will be mailed directly to the patient.

RECOMMENDATION:
Screening mammogram in one year. (Code:T4-5-GWO)

BI-RADS CATEGORY  1: Negative.

## 2023-06-14 DIAGNOSIS — H2513 Age-related nuclear cataract, bilateral: Secondary | ICD-10-CM | POA: Diagnosis not present

## 2023-06-14 DIAGNOSIS — H04123 Dry eye syndrome of bilateral lacrimal glands: Secondary | ICD-10-CM | POA: Diagnosis not present

## 2023-06-14 DIAGNOSIS — H1045 Other chronic allergic conjunctivitis: Secondary | ICD-10-CM | POA: Diagnosis not present

## 2023-08-10 DIAGNOSIS — Z1239 Encounter for other screening for malignant neoplasm of breast: Secondary | ICD-10-CM | POA: Diagnosis not present

## 2023-08-10 DIAGNOSIS — Z1231 Encounter for screening mammogram for malignant neoplasm of breast: Secondary | ICD-10-CM | POA: Diagnosis not present

## 2023-09-22 DIAGNOSIS — I10 Essential (primary) hypertension: Secondary | ICD-10-CM | POA: Diagnosis not present

## 2023-09-22 DIAGNOSIS — J45909 Unspecified asthma, uncomplicated: Secondary | ICD-10-CM | POA: Diagnosis not present

## 2023-09-22 DIAGNOSIS — J069 Acute upper respiratory infection, unspecified: Secondary | ICD-10-CM | POA: Diagnosis not present

## 2023-09-22 DIAGNOSIS — R051 Acute cough: Secondary | ICD-10-CM | POA: Diagnosis not present

## 2023-10-20 ENCOUNTER — Other Ambulatory Visit: Payer: Self-pay

## 2023-12-10 ENCOUNTER — Other Ambulatory Visit: Payer: Self-pay
